# Patient Record
Sex: Male | Born: 1970 | Race: White | Hispanic: No | Marital: Married
Health system: Midwestern US, Community
[De-identification: ages and names within clinical notes are randomized; demographics above are authoritative.]

## PROBLEM LIST (undated history)

## (undated) DIAGNOSIS — E782 Mixed hyperlipidemia: Secondary | ICD-10-CM

## (undated) DIAGNOSIS — M5442 Lumbago with sciatica, left side: Secondary | ICD-10-CM

## (undated) DIAGNOSIS — G8929 Other chronic pain: Secondary | ICD-10-CM

---

## 2003-04-02 NOTE — ED Provider Notes (Signed)
[facility]                      EMERGENCY DEPARTMENT TREATMENT REPORT   NAME:  Alejandro Tate                      PT. LOCATION:     ER  QC02   MR #:         BILLING #: [id number]          DOS: 04/02/2003   TIME: 4:02 P   [id number]   cc:   Primary Physician:   CHIEF COMPLAINT:   Pain, right shoulder.   HISTORY OF PRESENT ILLNESS:  This is a 32 year old male who presents with   pain to right shoulder.  States that a 2 x 10 fell onto his right shoulder   from the second story about 7-8 feet high.  It hit his shoulder and hit his   head.  His neck jerked to the side.  Complaining of pain to right shoulder.   No LOC.  Did not fall to the ground.  No numbness of the arm.  No other   complaints, including headache.   REVIEW OF SYSTEMS:   MUSCULOSKELETAL:  Positive pain, right shoulder.   NEUROLOGICAL:  No loss of consciousness.  Positive head trauma.   PAST MEDICAL HISTORY:   Negative.   MEDICATIONS:  None.   ALLERGIES:  None.   SOCIAL HISTORY:  The injury occurred at work, but states this is not   Doctor, hospital.   PHYSICAL EXAMINATION:   VITAL SIGNS:  Blood pressure 149/77, pulse 101, respirations 20,   temperature 98.5.   GENERAL:  The patient is a well-developed, well-nourished, male, alert.   NECK:  No vertebral tenderness.  Slight tenderness noted to right cervical   paraspinal muscles.   BACK:  Nontender.   EXTREMITY:  Right shoulder-Some tenderness noted to right proximal humerus.   Range of motion intact.  No deformity noted.  No edema noted.  No   ecchymosis.  No clavicular tenderness.  2+ radial pulses.  Light touch   sensation intact.   NEUROLOGICAL:  The patient alert, answering questions appropriately.  No   focal deficits.   CONTINUATION BY DIANA HOULE, PA-C:   DIAGNOSTIC STUDIES: An x-ray of the right shoulder was obtained and was   negative per the emergency department attending.   FINAL DIAGNOSIS:  Contusion, right shoulder.    DISPOSITION: The patient was discharged with a sling and a prescription for   Vicodin. Told to take Advil and ice to the shoulder intermittently. A work   note was given for out of work for two days. The patient was evaluated by   myself and Dr. Jama Flavors agrees with the above assessment and plan.   Electronically Signed By:   Imogene Burn, M.D. 04/04/2003 15:12   ____________________________   Imogene Burn, M.D.   jj/dd(pt2)  D:  04/02/2003  T:  04/02/2003  4:15 P   [id number]   DIANA A HOULE, PA-C

## 2015-06-10 ENCOUNTER — Ambulatory Visit
Admit: 2015-06-10 | Discharge: 2015-06-10 | Payer: PRIVATE HEALTH INSURANCE | Attending: Family Medicine | Primary: Family Medicine

## 2015-06-10 DIAGNOSIS — M5442 Lumbago with sciatica, left side: Secondary | ICD-10-CM

## 2015-06-10 MED ORDER — DICLOFENAC 50 MG TAB, DELAYED RELEASE
50 mg | ORAL_TABLET | Freq: Three times a day (TID) | ORAL | 2 refills | Status: DC
Start: 2015-06-10 — End: 2015-09-12

## 2015-06-10 MED ORDER — TRAMADOL 50 MG TAB
50 mg | ORAL_TABLET | Freq: Four times a day (QID) | ORAL | 0 refills | Status: DC | PRN
Start: 2015-06-10 — End: 2015-06-18

## 2015-06-10 NOTE — Progress Notes (Signed)
HISTORY OF PRESENT ILLNESS  Alejandro Tate is a 44 y.o. male.  HPI Comments: Pt comes in today c/o chronic left sided sciatica and acute low back pain. He says that he has been having left upper leg sciatica for about 2 years now. He says that one morning he was iron and he felt sharp pain in his left hip that felt like fire and brought him down to his knees in pain. He says that a few minutes later his whole left thigh went numb and it has been numb ever since. He says that he has seen 2 MDs, one being Dr Gertie Exon at [facility] and they have done MRIs of his hip and legs and has not given him any answers. He says that he has seen Neuro but no Ortho. He says that about a month ago he started having low back pain. He says that it is a dull and achy pain but with movements he will feel sharp pain and feel cracking and crumbling. He says that he has never had xrays of his back. He says that he does sit for long periods at work but tries to get up and walk around several times a day. He says that he has tried Flexeril, back brace, Ibuprofen, and heat with some relief. He denies fever, chills, sweats, N/V, chest pain, SOB, dizziness.    Back Pain    Associated symptoms include leg pain and tingling. Pertinent negatives include no chest pain, no fever, no headaches, no abdominal pain and no dysuria.   Leg Pain    Associated symptoms include tingling and back pain. Pertinent negatives include no neck pain.       Review of Systems   Constitutional: Negative for chills, diaphoresis, fever and malaise/fatigue.   HENT: Negative for congestion, ear pain, hearing loss, nosebleeds and sore throat.    Eyes: Negative for blurred vision, double vision, pain and redness.   Respiratory: Negative for cough, hemoptysis, sputum production, shortness of breath and wheezing.    Cardiovascular: Negative for chest pain, palpitations and leg swelling.   Gastrointestinal: Negative for abdominal pain, blood in stool,  constipation, diarrhea, nausea and vomiting.   Genitourinary: Negative for dysuria and hematuria.   Musculoskeletal: Positive for back pain. Negative for myalgias and neck pain.   Skin: Negative for rash.   Neurological: Positive for tingling and sensory change. Negative for dizziness, seizures, loss of consciousness and headaches.   Endo/Heme/Allergies: Does not bruise/bleed easily.   Psychiatric/Behavioral: Negative for depression, memory loss and substance abuse. The patient is not nervous/anxious.      Past Medical History   Diagnosis Date   ??? Chronic leg pain      left       Family History   Problem Relation Age of Onset   ??? Diabetes Mother    ??? Cancer Father        Past Surgical History   Procedure Laterality Date   ??? Hx wisdom teeth extraction         Social History     Social History   ??? Marital status: UNKNOWN     Spouse name: N/A   ??? Number of children: N/A   ??? Years of education: N/A     Occupational History   ??? Not on file.     Social History Main Topics   ??? Smoking status: Current Every Day Smoker     Packs/day: 0.50     Years: 20.00   ???  Smokeless tobacco: Not on file   ??? Alcohol use Yes      Comment: occassional   ??? Drug use: No   ??? Sexual activity: Not on file     Other Topics Concern   ??? Military Service Yes   ??? Blood Transfusions No   ??? Caffeine Concern No   ??? Occupational Exposure No   ??? Hobby Hazards No   ??? Sleep Concern No   ??? Stress Concern No   ??? Weight Concern No   ??? Special Diet No   ??? Back Care No   ??? Exercise Yes   ??? Bike Helmet No   ??? Seat Belt Yes   ??? Self-Exams No     Social History Narrative   ??? No narrative on file       Current Outpatient Prescriptions   Medication Sig Dispense Refill   ??? gabapentin (NEURONTIN) 600 mg tablet Take 900 mg by mouth three (3) times daily.  1   ??? diclofenac EC (VOLTAREN) 50 mg EC tablet Take 1 Tab by mouth three (3) times daily. 90 Tab 2   ??? traMADol (ULTRAM) 50 mg tablet Take 1 Tab by mouth every six (6) hours  as needed for Pain. Max Daily Amount: 200 mg. 60 Tab 0       No Known Allergies    Visit Vitals   ??? BP 137/74 (BP 1 Location: Right arm, BP Patient Position: Sitting)   ??? Pulse 81   ??? Temp 98.6 ??F (37 ??C) (Oral)   ??? Resp 18   ??? Ht 6' 1.66" (1.871 m)   ??? Wt 215 lb (97.5 kg)   ??? SpO2 98%   ??? BMI 27.86 kg/m2       Physical Exam   Constitutional: He is oriented to person, place, and time. He appears well-developed and well-nourished. No distress.   HENT:   Head: Normocephalic and atraumatic.   Right Ear: External ear normal.   Left Ear: External ear normal.   Nose: Nose normal.   Mouth/Throat: Oropharynx is clear and moist. No oropharyngeal exudate.   Eyes: Conjunctivae are normal. Pupils are equal, round, and reactive to light. Right eye exhibits no discharge. Left eye exhibits no discharge. No scleral icterus.   Neck: Normal range of motion. Neck supple. No tracheal deviation present. No thyromegaly present.   Cardiovascular: Normal rate, regular rhythm and normal heart sounds.  Exam reveals no gallop and no friction rub.    No murmur heard.  Pulmonary/Chest: Effort normal and breath sounds normal. No respiratory distress. He has no wheezes. He has no rales.   Abdominal: Soft. He exhibits no distension. There is no tenderness.   Musculoskeletal: He exhibits tenderness. He exhibits no edema.   Low back: TTP, pain with ROM esp flexion and lateral deviation   Lymphadenopathy:     He has no cervical adenopathy.   Neurological: He is alert and oriented to person, place, and time. Coordination normal.   Skin: Skin is warm and dry. No rash noted. He is not diaphoretic. No erythema.   Psychiatric: He has a normal mood and affect. His behavior is normal. Judgment and thought content normal.       ASSESSMENT and PLAN  1. Acute midline low back pain with left-sided sciatica  - XR SPINE LUMB 2 OR 3 V; Future ( pt will come tomorrow to get done)  - REFERRAL TO ORTHOPEDICS   - diclofenac EC (VOLTAREN) 50 mg EC tablet; Take 1  Tab by mouth three (3) times daily.  Dispense: 90 Tab; Refill: 2  - traMADol (ULTRAM) 50 mg tablet; Take 1 Tab by mouth every six (6) hours as needed for Pain. Max Daily Amount: 200 mg.  Dispense: 60 Tab; Refill: 0  - rest, heat, back brace, massage, follow up in 3 weeks    2. Left sided sciatica  - REFERRAL TO ORTHOPEDICS  - continue Neurontin    - follow up as needed    Plan and reference materials were reviewed with the patient and the patient expressed understanding.  Patient instructed that if symptoms/condition worsens or fails to resolve to come back to the office or go to the emergency room.    Durenda AgeJasmine K Brie Eppard, [place]

## 2015-06-10 NOTE — Progress Notes (Signed)
1. Have you been to the ER, urgent care clinic since your last visit?  Hospitalized since your last visit?no    2. Have you seen or consulted any other health care providers outside of the [facility] since your last visit?  Include any pap smears or colon screening. No    Patient Alejandro KohBrian Chevalier is a 44 y.o. male presents today for back and leg pain.

## 2015-06-10 NOTE — Patient Instructions (Addendum)
Rest back.  Heat/ice 3-4 times a day.  Continue with back brace. Consider massage.  Use 50 mg Voltaren every 4-6 hours with food as needed for pain.  Use Tramadol every 8 hours as needed for severe pain.  I will call with xray results.  Follow up in 3 weeks.  Please call with any questions or concerns.  If symptoms persist or worsen, please come back or go to the Emergency Room.      Back Care and Preventing Injuries: Care Instructions  Your Care Instructions  You can hurt your back doing many everyday activities: lifting a heavy box, bending down to garden, exercising at the gym, and even getting out of bed. But you can keep your back strong and healthy by doing some exercises. You also can follow a few tips for sitting, sleeping, and lifting to avoid hurting your back again.  Talk to your doctor before you start an exercise program. Ask for help if you want to learn more about keeping your back healthy.  Follow-up care is a key part of your treatment and safety. Be sure to make and go to all appointments, and call your doctor if you are having problems. It's also a good idea to know your test results and keep a list of the medicines you take.  How can you care for yourself at home?  ?? Stay at a healthy weight to avoid strain on your lower back.  ?? Do not smoke. Smoking increases the risk of osteoporosis, which weakens the spine. If you need help quitting, talk to your doctor about stop-smoking programs and medicines. These can increase your chances of quitting for good.  ?? Make sure you sleep in a position that maintains your back's normal curves and on a mattress that feels comfortable. Sleep on your side with a pillow between your knees, or sleep on your back with a pillow under your knees. These positions can reduce strain on your back.  ?? When you get out of bed, lie on your side and bend both knees. Drop your feet over the edge of the bed as you push up with both arms. Scoot to the  edge of the bed. Make sure your feet are in line with your rear end (buttocks), and then stand up.  ?? If you must stand for a long time, put one foot on a stool, ledge, or box.  Exercise to strengthen your back and other muscles  ?? Get at least 30 minutes of exercise on most days of the week. Walking is a good choice. You also may want to do other activities, such as running, swimming, cycling, or playing tennis or team sports.  ?? Stretch your back muscles. Here are few exercises to try:  ?? Lie on your back with your knees bent and your feet flat on the floor. Gently pull one bent knee to your chest. Put that foot back on the floor, and then pull the other knee to your chest. Hold for 15 to 30 seconds. Repeat 2 to 4 times.  ?? Do pelvic tilts. Lie on your back with your knees bent. Tighten your stomach muscles. Pull your belly button (navel) in and up toward your ribs. You should feel like your back is pressing to the floor and your hips and pelvis are slightly lifting off the floor. Hold for 6 seconds while breathing smoothly.  ?? Keep your core muscles strong. The muscles of your back, belly (abdomen), and buttocks support your spine.  ??  Pull in your belly, and imagine pulling your navel toward your spine. Hold this for 6 seconds, then relax. Remember to keep breathing normally as you tense your muscles.  ?? Do curl-ups. Always do them with your knees bent. Keep your low back on the floor, and curl your shoulders toward your knees using a smooth, slow motion. Keep your arms folded across your chest. If this bothers your neck, try putting your hands behind your neck (not your head), with your elbows spread apart.  ?? Lie on your back with your knees bent and your feet flat on the floor. Tighten your belly muscles, and then push with your feet and raise your buttocks up a few inches. Hold this position 6 seconds as you continue to breathe normally, then lower yourself slowly to the floor. Repeat 8 to 12 times.   ?? If you like group exercise, try Pilates or yoga. These classes have poses that strengthen the core muscles.  Protect your back when you sit  ?? Place a small pillow, a rolled-up towel, or a lumbar roll in the curve of your back if you need extra support.  ?? Sit in a chair that is low enough to let you place both feet flat on the floor with both knees nearly level with your hips. If your chair or desk is too high, use a foot rest to raise your knees.  ?? When driving, keep your knees nearly level with your hips. Sit straight, and drive with both hands on the steering wheel. Your arms should be in a slightly bent position.  ?? Try a kneeling chair, which helps tilt your hips forward. This takes pressure off your lower back.  ?? Try sitting on an exercise ball. It can rock from side to side, which helps keep your back loose.  Lift properly  ?? Squat down, bending at the hips and knees only. If you need to, put one knee to the floor and extend your other knee in front of you, bent at a right angle (half kneeling).  ?? Press your chest straight forward. This helps keep your upper back straight while keeping a slight arch in your low back.  ?? Hold the load as close to your body as possible, at the level of your navel.  ?? Use your feet to change direction, taking small steps.  ?? Lead with your hips as you change direction. Keep your shoulders in line with your hips as you move. Do not twist your body.  ?? Set down your load carefully, squatting with your knees and hips only.  When should you call for help?  Watch closely for changes in your health, and be sure to contact your doctor if:  ?? You want more exercises to make your back and other core muscles stronger.  Where can you learn more?  Go to [employer]  Enter S810 in the search box to learn more about "Back Care and Preventing Injuries: Care Instructions."  ?? 2006-2016 [employer]. Care instructions adapted under  license by [employer] (which disclaims liability or warranty for this information). This care instruction is for use with your licensed healthcare professional. If you have questions about a medical condition or this instruction, always ask your healthcare professional. [employer], Inc[employer] disclaims any warranty or liability for your use of this information.  Content Version: 11.0.578772; Current as of: Dec 23, 2014        Back Care and Preventing Injuries: Care Instructions  Your Care  Instructions  You can hurt your back doing many everyday activities: lifting a heavy box, bending down to garden, exercising at the gym, and even getting out of bed. But you can keep your back strong and healthy by doing some exercises. You also can follow a few tips for sitting, sleeping, and lifting to avoid hurting your back again.  Talk to your doctor before you start an exercise program. Ask for help if you want to learn more about keeping your back healthy.  Follow-up care is a key part of your treatment and safety. Be sure to make and go to all appointments, and call your doctor if you are having problems. It's also a good idea to know your test results and keep a list of the medicines you take.  How can you care for yourself at home?  ?? Stay at a healthy weight to avoid strain on your lower back.  ?? Do not smoke. Smoking increases the risk of osteoporosis, which weakens the spine. If you need help quitting, talk to your doctor about stop-smoking programs and medicines. These can increase your chances of quitting for good.  ?? Make sure you sleep in a position that maintains your back's normal curves and on a mattress that feels comfortable. Sleep on your side with a pillow between your knees, or sleep on your back with a pillow under your knees. These positions can reduce strain on your back.  ?? When you get out of bed, lie on your side and bend both knees. Drop your  feet over the edge of the bed as you push up with both arms. Scoot to the edge of the bed. Make sure your feet are in line with your rear end (buttocks), and then stand up.  ?? If you must stand for a long time, put one foot on a stool, ledge, or box.  Exercise to strengthen your back and other muscles  ?? Get at least 30 minutes of exercise on most days of the week. Walking is a good choice. You also may want to do other activities, such as running, swimming, cycling, or playing tennis or team sports.  ?? Stretch your back muscles. Here are few exercises to try:  ?? Lie on your back with your knees bent and your feet flat on the floor. Gently pull one bent knee to your chest. Put that foot back on the floor, and then pull the other knee to your chest. Hold for 15 to 30 seconds. Repeat 2 to 4 times.  ?? Do pelvic tilts. Lie on your back with your knees bent. Tighten your stomach muscles. Pull your belly button (navel) in and up toward your ribs. You should feel like your back is pressing to the floor and your hips and pelvis are slightly lifting off the floor. Hold for 6 seconds while breathing smoothly.  ?? Keep your core muscles strong. The muscles of your back, belly (abdomen), and buttocks support your spine.  ?? Pull in your belly, and imagine pulling your navel toward your spine. Hold this for 6 seconds, then relax. Remember to keep breathing normally as you tense your muscles.  ?? Do curl-ups. Always do them with your knees bent. Keep your low back on the floor, and curl your shoulders toward your knees using a smooth, slow motion. Keep your arms folded across your chest. If this bothers your neck, try putting your hands behind your neck (not your head), with your elbows spread apart.  ?? Lorenz Coaster  on your back with your knees bent and your feet flat on the floor. Tighten your belly muscles, and then push with your feet and raise your buttocks up a few inches. Hold this position 6 seconds as you continue to  breathe normally, then lower yourself slowly to the floor. Repeat 8 to 12 times.  ?? If you like group exercise, try Pilates or yoga. These classes have poses that strengthen the core muscles.  Protect your back when you sit  ?? Place a small pillow, a rolled-up towel, or a lumbar roll in the curve of your back if you need extra support.  ?? Sit in a chair that is low enough to let you place both feet flat on the floor with both knees nearly level with your hips. If your chair or desk is too high, use a foot rest to raise your knees.  ?? When driving, keep your knees nearly level with your hips. Sit straight, and drive with both hands on the steering wheel. Your arms should be in a slightly bent position.  ?? Try a kneeling chair, which helps tilt your hips forward. This takes pressure off your lower back.  ?? Try sitting on an exercise ball. It can rock from side to side, which helps keep your back loose.  Lift properly  ?? Squat down, bending at the hips and knees only. If you need to, put one knee to the floor and extend your other knee in front of you, bent at a right angle (half kneeling).  ?? Press your chest straight forward. This helps keep your upper back straight while keeping a slight arch in your low back.  ?? Hold the load as close to your body as possible, at the level of your navel.  ?? Use your feet to change direction, taking small steps.  ?? Lead with your hips as you change direction. Keep your shoulders in line with your hips as you move. Do not twist your body.  ?? Set down your load carefully, squatting with your knees and hips only.  When should you call for help?  Watch closely for changes in your health, and be sure to contact your doctor if:  ?? You want more exercises to make your back and other core muscles stronger.  Where can you learn more?  Go to [employer]  Enter S810 in the search box to learn more about "Back Care and Preventing Injuries: Care Instructions."   ?? 2006-2016 [employer]. Care instructions adapted under license by [employer] (which disclaims liability or warranty for this information). This care instruction is for use with your licensed healthcare professional. If you have questions about a medical condition or this instruction, always ask your healthcare professional. [employer] disclaims any warranty or liability for your use of this information.  Content Version: 11.0.578772; Current as of: Dec 23, 2014

## 2015-06-11 ENCOUNTER — Encounter
Admit: 2015-06-11 | Discharge: 2015-06-11 | Payer: PRIVATE HEALTH INSURANCE | Attending: Family Medicine | Primary: Family Medicine

## 2015-06-11 ENCOUNTER — Encounter: Admit: 2015-06-11 | Primary: Family Medicine

## 2015-06-11 DIAGNOSIS — M5442 Lumbago with sciatica, left side: Secondary | ICD-10-CM

## 2015-06-11 NOTE — Progress Notes (Signed)
XRAY ONLY.

## 2015-06-13 NOTE — Telephone Encounter (Signed)
Called pt to go over xray results. I let him know that there were some abnormalities and arthritic changes and he needs to see Ortho. He says that he has an appt in December. He says that he would like a copy of the results. He says that the pain meds is helping a little and I told him that he can add Tylenol with Tramadol for more pain relief. The patient expressed understanding and had no further questions.

## 2015-06-18 ENCOUNTER — Ambulatory Visit
Admit: 2015-06-18 | Discharge: 2015-06-18 | Payer: PRIVATE HEALTH INSURANCE | Attending: Family Medicine | Primary: Family Medicine

## 2015-06-18 DIAGNOSIS — M5442 Lumbago with sciatica, left side: Secondary | ICD-10-CM

## 2015-06-18 MED ORDER — OXYCODONE-ACETAMINOPHEN 5 MG-325 MG TAB
5-325 mg | ORAL_TABLET | ORAL | 0 refills | Status: DC | PRN
Start: 2015-06-18 — End: 2015-06-23

## 2015-06-18 NOTE — Patient Instructions (Addendum)
Sciatica: Care Instructions  Your Care Instructions     Sciatica (say "sye-AT-ih-kuh") is an irritation of one of the sciatic nerves, which come from the spinal cord in the lower back. The sciatic nerves and their branches extend down through the buttock to the foot. Sciatica can develop when an injured disc in the back presses against a spinal nerve root. Its main symptom is pain, numbness, or weakness that is often worse in the leg or foot than in the back.  Sciatica often will improve and go away with time. Early treatment usually includes medicines and exercises to relieve pain.  Follow-up care is a key part of your treatment and safety. Be sure to make and go to all appointments, and call your doctor if you are having problems. It's also a good idea to know your test results and keep a list of the medicines you take.  How can you care for yourself at home?  ?? Take pain medicines exactly as directed.  ?? If the doctor gave you a prescription medicine for pain, take it as prescribed.  ?? If you are not taking a prescription pain medicine, ask your doctor if you can take an over-the-counter medicine.  ?? Use heat or ice to relieve pain.  ?? To apply heat, put a warm water bottle, heating pad set on low, or warm cloth on your back. Do not go to sleep with a heating pad on your skin.  ?? To use ice, put ice or a cold pack on the area for 10 to 20 minutes at a time. Put a thin cloth between the ice and your skin.  ?? Avoid sitting if possible, unless it feels better than standing.  ?? Alternate lying down with short walks. Increase your walking distance as you are able to without making your symptoms worse.  ?? Do not do anything that makes your symptoms worse.  When should you call for help?  Call 911 anytime you think you may need emergency care. For example, call if:  ?? You are unable to move a leg at all.  Call your doctor now or seek immediate medical care if:   ?? You have new or worse symptoms in your legs or buttocks. Symptoms may include:  ?? Numbness or tingling.  ?? Weakness.  ?? Pain.  ?? You lose bladder or bowel control.  Watch closely for changes in your health, and be sure to contact your doctor if:  ?? You are not getting better as expected.  Where can you learn more?  Go to http://www.healthwise.net/GoodHelpConnections  Enter Z239 in the search box to learn more about "Sciatica: Care Instructions."  ?? 2006-2016 Healthwise[employer]. Care instructions adapted under license by [employer] (which disclaims liability or warranty for this information). This care instruction is for use with your licensed healthcare professional. If you have questions about a medical condition or this instruction, always ask your healthcare professional. Healthwise, Incorporated disclaims any warranty or liability for your use of this information.  Content Version: 11.0.578772; Current as of: Dec 23, 2014

## 2015-06-18 NOTE — Progress Notes (Signed)
HISTORY OF PRESENT ILLNESS  Alejandro Tate is a 44 y.o. male.  HPI Comments: Pt comes in today for f/u low back pain with left sided sciatica. He says that the pain feels like it is getting worse. He says that when he is not moving he is okay but once he starts moving the pain is worse. He explains that he tried the Tramadol with the Tylenol and it is not working at all to help with the pain. He says that he has an appt with Dr Hinton Dyer (ortho) at 9:30 am today. He denies fever, chills, sweats, N/V, chest pain, SOB, dizziness.    Back Pain    Pertinent negatives include no chest pain, no fever, no headaches, no abdominal pain, no dysuria and no tingling.       Review of Systems   Constitutional: Negative for chills, diaphoresis, fever and malaise/fatigue.   HENT: Negative for congestion, ear pain, hearing loss, nosebleeds and sore throat.    Eyes: Negative for blurred vision, double vision, pain and redness.   Respiratory: Negative for cough, hemoptysis, sputum production, shortness of breath and wheezing.    Cardiovascular: Negative for chest pain, palpitations and leg swelling.   Gastrointestinal: Negative for abdominal pain, blood in stool, constipation, diarrhea, nausea and vomiting.   Genitourinary: Negative for dysuria and hematuria.   Musculoskeletal: Positive for back pain. Negative for myalgias and neck pain.   Skin: Negative for rash.   Neurological: Negative for dizziness, tingling, sensory change, seizures, loss of consciousness and headaches.   Endo/Heme/Allergies: Does not bruise/bleed easily.   Psychiatric/Behavioral: Negative for depression, memory loss and substance abuse. The patient is not nervous/anxious.      Past Medical History   Diagnosis Date   ??? Chronic leg pain      left       Family History   Problem Relation Age of Onset   ??? Diabetes Mother    ??? Cancer Father        Past Surgical History   Procedure Laterality Date   ??? Hx wisdom teeth extraction         Social History      Social History   ??? Marital status: MARRIED     Spouse name: N/A   ??? Number of children: N/A   ??? Years of education: N/A     Occupational History   ??? Not on file.     Social History Main Topics   ??? Smoking status: Current Every Day Smoker     Packs/day: 0.50     Years: 20.00   ??? Smokeless tobacco: Not on file   ??? Alcohol use Yes      Comment: occassional   ??? Drug use: No   ??? Sexual activity: Not on file     Other Topics Concern   ??? Military Service Yes   ??? Blood Transfusions No   ??? Caffeine Concern No   ??? Occupational Exposure No   ??? Hobby Hazards No   ??? Sleep Concern No   ??? Stress Concern No   ??? Weight Concern No   ??? Special Diet No   ??? Back Care No   ??? Exercise Yes   ??? Bike Helmet No   ??? Seat Belt Yes   ??? Self-Exams No     Social History Narrative       Current Outpatient Prescriptions   Medication Sig Dispense Refill   ??? oxyCODONE-acetaminophen (PERCOCET) 5-325 mg per tablet Take 1 Tab by  mouth every four (4) hours as needed for Pain. Max Daily Amount: 6 Tabs. 30 Tab 0   ??? gabapentin (NEURONTIN) 600 mg tablet Take 900 mg by mouth three (3) times daily.  1   ??? diclofenac EC (VOLTAREN) 50 mg EC tablet Take 1 Tab by mouth three (3) times daily. 90 Tab 2       No Known Allergies    Visit Vitals   ??? BP 140/80 (BP 1 Location: Right arm, BP Patient Position: Sitting)   ??? Pulse 76   ??? Temp 99 ??F (37.2 ??C) (Oral)   ??? Resp 16   ??? Ht 6' 1.66" (1.871 m)   ??? Wt 215 lb (97.5 kg)   ??? SpO2 97%   ??? BMI 27.86 kg/m2       Physical Exam   Constitutional: He is oriented to person, place, and time. He appears well-developed and well-nourished. No distress.   HENT:   Head: Normocephalic and atraumatic.   Right Ear: External ear normal.   Left Ear: External ear normal.   Nose: Nose normal.   Mouth/Throat: Oropharynx is clear and moist. No oropharyngeal exudate.   Eyes: Conjunctivae are normal. Pupils are equal, round, and reactive to light. Right eye exhibits no discharge. Left eye exhibits no discharge. No scleral icterus.    Neck: Normal range of motion. Neck supple. No tracheal deviation present. No thyromegaly present.   Cardiovascular: Normal rate, regular rhythm and normal heart sounds.  Exam reveals no gallop and no friction rub.    No murmur heard.  Pulmonary/Chest: Effort normal and breath sounds normal. No respiratory distress. He has no wheezes. He has no rales.   Abdominal: Soft. He exhibits no distension. There is no tenderness.   Musculoskeletal: He exhibits edema and tenderness.   Low back: TTP, pain with ROM   Lymphadenopathy:     He has no cervical adenopathy.   Neurological: He is alert and oriented to person, place, and time. Coordination normal.   Skin: Skin is warm and dry. No rash noted. He is not diaphoretic. No erythema.   Psychiatric: He has a normal mood and affect. His behavior is normal. Judgment and thought content normal.       ASSESSMENT and PLAN  1. Acute bilateral low back pain with left-sided sciatica  - oxyCODONE-acetaminophen (PERCOCET) 5-325 mg per tablet; Take 1 Tab by mouth every four (4) hours as needed for Pain. Max Daily Amount: 6 Tabs.  Dispense: 30 Tab; Refill: 0  - pt will follow up with Ortho  - pt will call if needs more pain meds    2. Left sided sciatica  - oxyCODONE-acetaminophen (PERCOCET) 5-325 mg per tablet; Take 1 Tab by mouth every four (4) hours as needed for Pain. Max Daily Amount: 6 Tabs.  Dispense: 30 Tab; Refill: 0    Plan and reference materials were reviewed with the patient and the patient expressed understanding.  Patient instructed that if symptoms/condition worsens or fails to resolve to come back to the office or go to the emergency room.    Durenda AgeJasmine K Azizi Bally, [place]

## 2015-06-18 NOTE — Progress Notes (Signed)
Alejandro KohBrian Tate is a 44 y.o. male presents to office for follow up on back pain. Patient stated that he feels worse today than he did at the initial visit. Stated that if he gets out of bed that is when the pain starts. Stated that he is okay when he is laying flat. Patient stated that he is scheduled to see ortho today at 9:30     1. Have you been to the ER, urgent care clinic or hospitalized since your last visit? no  2. [facility] since your [facility] visit? no

## 2015-06-23 ENCOUNTER — Encounter

## 2015-06-23 NOTE — Telephone Encounter (Signed)
Pt calling to request refill on pended medication. Pt states Jasmine told him he can just call in and request this medication. Pt is also requesting a work note to put him on light duty. Please advise.

## 2015-06-24 NOTE — Telephone Encounter (Signed)
Mrs. Alejandro Tate,  Pt is requesting a work note restricting him to light duty, no standing for long periods of time, also he is following up on percocet Rx. Pt stated that he still have some but with the holiday and him starting PT will need refill soon. Please advise.

## 2015-06-25 MED ORDER — OXYCODONE-ACETAMINOPHEN 5 MG-325 MG TAB
5-325 mg | ORAL_TABLET | ORAL | 0 refills | Status: DC | PRN
Start: 2015-06-25 — End: 2015-09-12

## 2015-06-25 NOTE — Telephone Encounter (Signed)
Returning pt call. He says that he will start PT. He says that he takes the meds as needed. He says that he needs a work note for light duty. I told him that both would be waiting up front for hi. The patient expressed understanding and had no further questions.

## 2015-07-01 ENCOUNTER — Encounter: Attending: Family Medicine | Primary: Family Medicine

## 2015-07-02 ENCOUNTER — Inpatient Hospital Stay: Admit: 2015-07-02 | Payer: PRIVATE HEALTH INSURANCE | Primary: Family Medicine

## 2015-07-02 DIAGNOSIS — M545 Low back pain: Secondary | ICD-10-CM

## 2015-07-02 NOTE — Progress Notes (Signed)
[facility] ??? [facility] PHYSICAL THERAPY  [address], [place], [place] [address] - Phone: [phone]  Fax: [phone]  PLAN OF CARE / STATEMENT OF MEDICAL NECESSITY FOR PHYSICAL THERAPY SERVICES  Patient Name: Alejandro Tate DOB: 25-Sep-1970   Medical   Diagnosis: LBP Treatment Diagnosis: Low back pain [M54.5]   Onset Date: Chronic, 6-7 years      Referral Source: Delorse Limber, MD Start of Care [facility]): 07/02/2015   Prior Hospitalization: See medical history Provider #: [phone]   Prior Level of Function: Chronic LBP w/ ADLs and L LE numbness x 2 years   Comorbidities: OA, alcohol use, tobacco use, weight change >10# recently   Medications: Verified on Patient Summary List   The Plan of Care and following information is based on the information from the initial evaluation.   ===========================================================================================  Assessment / key information:  44 y/o M presents to PT w/ c/o chronic LBP that began approx 6-7 years ago insidiously. Pt reports symptoms worsened over the last two months but is unable to recall any cause for inc in symptoms. Pain currently ranges 3-10/10 and are located primarily L low back, w/ numbness L upper thigh and intermittent paresthesia into the L calf and foot. Pt c/o weakness L LE, w/ occasional buckling of his L knee. Symptoms are made worse with prolonged sitting (>30 minutes) or standing (>15 minutes) and made better with laying flat supine. 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 Pt also reports he received MRI and NCS several years ago, that he states showed protrusion at L3/4 and dec conduction conduction velocity of the L femoral nerve. Pt denies bowel/bladder dysfunction, but does c/o intermittent pain w/ sneezing.   Objective exam: AROM: lumbar AROM: flex to mid shin (p!), ext dec 50%  (p!), B SB WNL (p!) reported w/ R SB, L rot WNL w/ relief of p!, R rot dec 50% (p!). T/s rot AROM dec 50% to the R, 25% to the L. Dec hip IR PROM B; relief reported w/ hip ER stretch. Strength: Dec strength noted L iliopsoas, L quad and L glute med. (p!) reported on supine brige in the L l/s. Sensation: dec sensation to light touch noted L L3/4 distribution. Special test: (-)SLR B, (-)Slump B, (+)90/90 HS B, (+)Ely L, POE: no change; REIL: peripheralized symptoms to the L foot, RFIL: peripheralized symptoms to the L foot. SKTC: dec symptoms in the L LE. Palpation: Dec tissue extensibility w/ TTP noted L l/s paraspinals, glute med, and QL. Dec t/s and l/s PIVM. FOTO: 54/100.   Pt educated on objective findings, prognosis, POC, body mechanics w/ transfers, and HEP. Pt may benefit from course of PT to address above impairments and functional limitations to enable pt improved activity tolerance.  ===========================================================================================  Problem List: pain affecting function, decrease ROM, decrease strength, impaired gait/ balance, decrease ADL/ functional abilitiies, decrease activity tolerance, decrease flexibility/ joint mobility and decrease transfer abilities   Treatment Plan may include any combination of the following: Therapeutic exercise, Therapeutic activities, Neuromuscular re-education, Physical agent/modality, Gait/balance training, Manual therapy, Patient education, Self Care training, Functional mobility training and Home safety training  Patient / Family readiness to learn indicated by: asking questions, trying to perform skills and interest  Persons(s) to be included in education: patient (P)  Barriers to Learning/Limitations: None  Measures taken:    Patient Goal (s): "pain-free"   Patient self reported health  status: fair  Rehabilitation Potential: good  ? Short Term Goals: To be accomplished in  3  weeks:   1. Pt w// b I and compliant with HEP for self management of symptoms  2. Dec max pain </= 6/10 to improve tolerance to ADLs  ? Long Term Goals: To be accomplished in  6  weeks:  1. Pt will demo l/s AROM WNL all planes to improve ease of transfers and ADLs  2. Improve L quad and psoas MMT to at least 4+/5 to improve stability w/ prolonged ambulation and stair negotiation  3. Improve FOTO score to >/= 64/100 to indicate improved function    Frequency / Duration:   Patient to be seen  2-3  times per week for 4-6  weeks:  Patient / Caregiver education and instruction: exercises  G-Codes (GP): na  Therapist Signature: Garrison ColumbusJenna L Jeswald, PT Date: 07/02/2015   Certification Period: na Time: 5:47 PM   ===========================================================================================  I certify that the above Physical Therapy Services are being furnished while the patient is under my care.  I agree with the treatment plan and certify that this therapy is necessary.    Physician Signature:        Date:       Time:     Please sign and return to [facility] or you may fax the signed copy to [phone].  Thank you.

## 2015-07-02 NOTE — Progress Notes (Signed)
PHYSICAL THERAPY - DAILY TREATMENT NOTE    Patient Name: Alejandro Tate        Date: 07/02/2015  DOB: 07-14-1971   yes Patient DOB Verified  Visit #:   1   of   12  Insurance: Payor: Medical sales representative / Plan: [employer] PPO / Product Type: PPO /      In time: 4:35 Out time: 5:20   Total Treatment Time: 45     Medicare Time Tracking (below)   Total Timed Codes (min):  na 1:1 Treatment Time:  na     TREATMENT AREA =  Low back pain [M54.5]    SUBJECTIVE  Pain Level (on 0 to 10 scale):  6  / 10   Medication Changes/New allergies or changes in medical history, any new surgeries or procedures?    no  If yes, update Summary List   Subjective Functional Status/Changes:    No changes reported     See POC          OBJECTIVE    8 min Therapeutic Exercise:    See flow sheet   Rationale:      increase ROM and increase strength to improve the patient???s ability to tolerate prolonged standing, sitting     10 min Manual Therapy: DTM to L QL, l/s paraspinals, glute min, piriformis str   Rationale:      decrease pain, increase ROM, increase tissue extensibility and decrease trigger points to improve patient's ability to tolerate prolonged standing, sitting     min Patient Education:  yes  Reviewed HEP     Progressed/Changed HEP based on:       Other Objective/Functional Measures:    Pt demo I w/ HEP  Reported dec L LE pain following tx session     Post Treatment Pain Level (on 0 to 10) scale:   2  / 10     ASSESSMENT  Assessment/Changes in Function:     See POC       See Progress Note/Recertification   Patient will continue to benefit from skilled PT services to modify and progress therapeutic interventions, address functional mobility deficits, address ROM deficits, address strength deficits, analyze and address soft tissue restrictions, analyze and cue movement patterns, analyze and modify body mechanics/ergonomics, assess and modify postural abnormalities and instruct in home and community integration to attain remaining goals.    Progress toward goals / Updated goals:    See POC     PLAN    Upgrade activities as tolerated yes Continue plan of care     Discharge due to :      Other:      Therapist: Garrison Lake Lure, PT    Date: 07/02/2015 Time: 5:45 PM     Future Appointments  Date Time Provider Department Center   07/08/2015 4:00 PM Molli Knock, PT [facility]   07/16/2015 4:00 PM Garrison Platinum, PT [facility]   07/21/2015 4:00 PM Garrison [place], PT [facility]   07/23/2015 4:00 PM Garrison Garrett, PT [facility]   07/24/2015 4:30 PM Molli Knock, PT [facility]   07/29/2015 4:00 PM Garrison [place], PT [facility]   07/30/2015 4:00 PM Garrison Humb[place], PT [facility]   07/31/2015 4:30 PM Molli Knock, PT [facility]   08/05/2015 4:00 PM Molli Knock, PT [facility]   08/07/2015 4:00 PM Molli Knock, PT [facility]   08/11/2015 4:00 PM Garrison Naylor, PT  [facility]   08/13/2015 4:00 PM Garrison ColumbusJenna L Jeswald, PT [facility]   08/14/2015 4:00 PM Molli KnockKatie L Hartzog, PT [facility]

## 2015-07-08 ENCOUNTER — Inpatient Hospital Stay
Admit: 2015-07-08 | Payer: PRIVATE HEALTH INSURANCE | Attending: Rehabilitative and Restorative Service Providers" | Primary: Family Medicine

## 2015-07-08 DIAGNOSIS — M545 Low back pain: Secondary | ICD-10-CM

## 2015-07-08 NOTE — Progress Notes (Signed)
Marland Kitchen  PHYSICAL THERAPY - DAILY TREATMENT NOTE    Patient Name: Alejandro Tate        Date: 07/08/2015  DOB: [birth year]   yes Patient DOB Verified  Visit #:   2   of   12  Insurance: Payor: Medical sales representative / Plan: BSHSI CIGNA PPO / Product Type: PPO /      In time: 4:00 Out time: 5:05   Total Treatment Time: 65     Medicare Time Tracking (below)   Total Timed Codes (min):  na 1:1 Treatment Time:  na     TREATMENT AREA =  Low back pain [M54.5]    SUBJECTIVE  Pain Level (on 0 to 10 scale):  3  / 10   Medication Changes/New allergies or changes in medical history, any new surgeries or procedures?    no  If yes, update Summary List   Subjective Functional Status/Changes:    No changes reported     i have been doing my exercises and i am no better no worse.           OBJECTIVE  Modalities Rationale:     decrease inflammation, decrease pain and increase tissue extensibility to improve patient's ability to bend/twist/turn  10 min  Estim, type/location:  prone                                      att       unatt       w/US       w/ice      w/heat    min   Mechanical Traction: type/lbs                     pro     sup     int     cont      before manual      after manual    min   Ultrasound, settings/location:      min   Iontophoresis w/ dexamethasone, location:                                                 take home patch         in clinic    min   Ice       Heat    location/position:     min   Vasopneumatic Device, press/temp:     min   Other:     Skin assessment post-treatment (if applicable):      intact      redness- no adverse reaction     redness ??? adverse reaction:        40 min Therapeutic Exercise:    See flow sheet   Rationale:      increase ROM and increase strength to improve the patient???s ability to bend/stoop     15 min Manual Therapy: Grade i-iii pain mobs l2-5, L Uniglides, stm/dtm ls paraspinals, glutes, QL   Rationale:      decrease pain, increase ROM, increase tissue extensibility  and decrease trigger points to improve patient's ability to sit/bend  Rationale:         min Patient Education:  yes  Reviewed HEP     Progressed/Changed HEP based  on:       Other Objective/Functional Measures:    Pt with trp along glute med at crest and increased restrictions along L lower ls paraspinals  Noted relief with PA mobs - progressed with ppu and will assess response next session to determine directional preference     Post Treatment Pain Level (on 0 to 10) scale:   2  / 10     ASSESSMENT  Assessment/Changes in Function:     Reviewed mri with pt from 2014 - states he has yet to have any additional imaging since that time. Due to inability to determine exacerbating adls/positions - pt was advised to monitor       See Progress Note/Recertification   Patient will continue to benefit from skilled PT services to modify and progress therapeutic interventions, address functional mobility deficits, address ROM deficits, address strength deficits, analyze and address soft tissue restrictions, analyze and cue movement patterns, analyze and modify body mechanics/ergonomics, assess and modify postural abnormalities and instruct in home and community integration to attain remaining goals.   Progress toward goals / Updated goals:    Compliant with hep     PLAN    Upgrade activities as tolerated yes Continue plan of care     Discharge due to :      Other:      Therapist: Molli KnockKatie L Eris Hannan, PT    Date: 07/08/2015 Time: 5:15 PM     Future Appointments  Date Time Provider Department Center   07/16/2015 4:00 PM Garrison ColumbusJenna L Jeswald, [facility]   07/21/2015 4:00 PM Garrison ColumbusJenna L Jeswald, [facility]   07/23/2015 4:00 PM Garrison ColumbusJenna L Jeswald, PT [facility]   07/24/2015 4:30 PM Molli KnockKatie L Mykayla Brinton, PT [facility]   07/29/2015 4:00 PM Garrison ColumbusJenna L Jeswald, PT [facility]   07/30/2015 4:00 PM Garrison ColumbusJenna L Jeswald, PT [facility]   07/31/2015 4:30 PM Molli KnockKatie L Ariaunna Longsworth, PT [facility]   08/05/2015 4:00 PM Molli KnockKatie L Michel Eskelson, PT [facility]    08/07/2015 4:00 PM Molli KnockKatie L Chyane Greer, PT [facility]   08/11/2015 4:00 PM Garrison ColumbusJenna L Jeswald, PT [facility]   08/13/2015 4:00 PM Garrison ColumbusJenna L Jeswald, PT [facility]   08/14/2015 4:00 PM Molli KnockKatie L Necola Bluestein, PT [facility]

## 2015-07-16 ENCOUNTER — Inpatient Hospital Stay: Admit: 2015-07-16 | Payer: PRIVATE HEALTH INSURANCE | Primary: Family Medicine

## 2015-07-16 NOTE — Progress Notes (Signed)
PHYSICAL THERAPY - DAILY TREATMENT NOTE    Patient Name: Alejandro Tate        Date: 07/16/2015  DOB: 1971-07-06   yes Patient DOB Verified  Visit #:   3   of   12  Insurance: Payor: Medical sales representative / Plan: BSHSI CIGNA PPO / Product Type: PPO /      In time: 4:06 Out time: 4:56   Total Treatment Time: 50     Medicare Time Tracking (below)   Total Timed Codes (min):  na 1:1 Treatment Time:  na     TREATMENT AREA =  Low back pain [M54.5]    SUBJECTIVE  Pain Level (on 0 to 10 scale):  2  / 10   Medication Changes/New allergies or changes in medical history, any new surgeries or procedures?    no  If yes, update Summary List   Subjective Functional Status/Changes:    No changes reported     Pt reports he feels slightly better. Continues w/ numbness L thigh and intermittent numbness L lower leg. Reports soreness after last visit but no inc in pain.          OBJECTIVE  Modalities Rationale:     decrease inflammation and decrease pain to improve patient's ability to complete ADLs  10 min  Estim, type/location:  ifc to l/s in prone                                      att       unatt       w/US       w/ice      w/heat    min   Mechanical Traction: type/lbs                     pro     sup     int     cont      before manual      after manual    min   Ultrasound, settings/location:      min   Iontophoresis w/ dexamethasone, location:                                                 take home patch         in clinic    min   Ice       Heat    location/position:     min   Vasopneumatic Device, press/temp:     min   Other:     Skin assessment post-treatment (if applicable):      intact      redness- no adverse reaction     redness ??? adverse reaction:        28 min Therapeutic Exercise:    See flow sheet   Rationale:      increase ROM, increase strength and improve coordination to improve the patient???s ability to  Tolerate prolonged sitting    12 min Manual Therapy: DTM to L>R l/s paraspinals, L glute min, QL; L  multifidi release, L2-5 PA mobs gr II, L3/4 L uniglide   Rationale:      decrease pain, increase ROM, increase tissue extensibility and decrease trigger points to improve patient's ability to bend,  complete ADLs     min Patient Education:  yes  Reviewed HEP     Progressed/Changed HEP based on:       Other Objective/Functional Measures:    Hypertonicity noted L l/s paraspinals and QL  PPU w/ no change to L thigh symptoms  Added core band to deadbugs to assist w/ core activation to deadbugs     Post Treatment Pain Level (on 0 to 10) scale:   1  / 10     ASSESSMENT  Assessment/Changes in Function:     Pt reported dec l/s pain post tx session but no change to L thigh sx. Advised pt to continue PPU as tolerated       See Progress Note/Recertification   Patient will continue to benefit from skilled PT services to modify and progress therapeutic interventions, address functional mobility deficits, address ROM deficits, address strength deficits, analyze and address soft tissue restrictions, analyze and cue movement patterns, analyze and modify body mechanics/ergonomics and instruct in home and community integration to attain remaining goals.   Progress toward goals / Updated goals:    Progressing to STG #2, reports max pain in the last week 6/10     PLAN    Upgrade activities as tolerated yes Continue plan of care     Discharge due to :      Other:      Therapist: Garrison ColumbusJenna L Jeswald, PT    Date: 07/16/2015 Time: 4:10 PM     Future Appointments  Date Time Provider Department Center   07/21/2015 4:00 PM Garrison ColumbusJenna L Jeswald, PT [facility]   07/23/2015 4:00 PM Garrison ColumbusJenna L Jeswald, PT [facility]   07/24/2015 4:30 PM Molli KnockKatie L Hartzog, PT [facility]   07/29/2015 4:00 PM Garrison ColumbusJenna L Jeswald, PT [facility]   07/30/2015 4:00 PM Garrison ColumbusJenna L Jeswald, PT [facility]   07/31/2015 4:30 PM Molli KnockKatie L Hartzog, PT [facility]   08/05/2015 4:00 PM Molli KnockKatie L Hartzog, PT [facility]   08/07/2015 4:00 PM Molli KnockKatie L Hartzog, PT [facility]    08/11/2015 4:00 PM Garrison ColumbusJenna L Jeswald, PT [facility]   08/13/2015 4:00 PM Garrison ColumbusJenna L Jeswald, PT [facility]   08/14/2015 4:00 PM Molli KnockKatie L Hartzog, PT [facility]

## 2015-07-21 ENCOUNTER — Inpatient Hospital Stay: Admit: 2015-07-21 | Payer: PRIVATE HEALTH INSURANCE | Primary: Family Medicine

## 2015-07-21 NOTE — Progress Notes (Signed)
PHYSICAL THERAPY - DAILY TREATMENT NOTE    Patient Name: Alejandro Tate        Date: 07/21/2015  DOB: [birth year]   yes Patient DOB Verified  Visit #:   4   of   12  Insurance: Payor: Medical sales representative / Plan: BSHSI CIGNA PPO / Product Type: PPO /      In time: 3:59 Out time: 4;48   Total Treatment Time: 49     Medicare Time Tracking (below)   Total Timed Codes (min):  na 1:1 Treatment Time:  na     TREATMENT AREA =  Low back pain [M54.5]    SUBJECTIVE  Pain Level (on 0 to 10 scale):  4  / 10   Medication Changes/New allergies or changes in medical history, any new surgeries or procedures?    no  If yes, update Summary List   Subjective Functional Status/Changes:    No changes reported     Pt reports inc back pain this AM due to lifting things around the house this weekend. Reports minimal L LE pain this visit.         OBJECTIVE  Modalities Rationale:     decrease inflammation and decrease pain to improve patient's ability to complete ADLs  10 min  Estim, type/location: IFC to l/s in prone                                      att       unatt       w/US       w/ice      w/heat    min   Mechanical Traction: type/lbs                     pro     sup     int     cont      before manual      after manual    min   Ultrasound, settings/location:      min   Iontophoresis w/ dexamethasone, location:                                                 take home patch         in clinic    min   Ice       Heat    location/position:     min   Vasopneumatic Device, press/temp:     min   Other:     Skin assessment post-treatment (if applicable):      intact      redness- no adverse reaction     redness ??? adverse reaction:        29 min Therapeutic Exercise:    See flow sheet   Rationale:      increase ROM and increase strength to improve the patient???s ability to transfer, stand, ambulate     10 min Manual Therapy: DTM to L>R l/s paraspinals, L glute min, piri, L2-5 PA mobs gr II, L L3/4 multifidi release    Rationale:      decrease pain, increase ROM, increase tissue extensibility and decrease trigger points to improve patient's ability to transfer, tolerate prolonged sitting, standing  min Patient Education:  yes  Reviewed HEP     Progressed/Changed HEP based on:       Other Objective/Functional Measures:    Cont w/ dec PIVM l/s and dec L quad flexibility  Inc discomfort supine bridge     Post Treatment Pain Level (on 0 to 10) scale:   1  / 10     ASSESSMENT  Assessment/Changes in Function:     Pt reported absent LE symptoms and dec l/s pain post tx       See Progress Note/Recertification   Patient will continue to benefit from skilled PT services to modify and progress therapeutic interventions, address functional mobility deficits, address ROM deficits, address strength deficits, analyze and address soft tissue restrictions, analyze and cue movement patterns, analyze and modify body mechanics/ergonomics and instruct in home and community integration to attain remaining goals.   Progress toward goals / Updated goals:    No significant change this visit     PLAN    Upgrade activities as tolerated yes Continue plan of care     Discharge due to :      Other:      Therapist: Garrison ColumbusJenna L Jeswald, PT    Date: 07/21/2015 Time: 4:39 PM     Future Appointments  Date Time Provider Department Center   07/23/2015 4:00 PM Garrison ColumbusJenna L Jeswald, PT [facility]   07/24/2015 4:30 PM Molli KnockKatie L Hartzog, PT [facility]   07/29/2015 4:00 PM Garrison ColumbusJenna L Jeswald, PT [facility]   07/30/2015 4:00 PM Garrison ColumbusJenna L Jeswald, PT [facility]   07/31/2015 4:30 PM Molli KnockKatie L Hartzog, PT [facility]   08/05/2015 4:00 PM Molli KnockKatie L Hartzog, PT [facility]   08/07/2015 4:00 PM Molli KnockKatie L Hartzog, PT [facility]   08/11/2015 4:00 PM Garrison ColumbusJenna L Jeswald, PT [facility]   08/13/2015 4:00 PM Garrison ColumbusJenna L Jeswald, PT [facility]   08/14/2015 4:00 PM Molli KnockKatie L Hartzog, PT [facility]

## 2015-07-23 ENCOUNTER — Inpatient Hospital Stay: Admit: 2015-07-23 | Payer: PRIVATE HEALTH INSURANCE | Primary: Family Medicine

## 2015-07-23 NOTE — Progress Notes (Signed)
PHYSICAL THERAPY - DAILY TREATMENT NOTE    Patient Name: Alejandro Tate        Date: 07/23/2015  DOB: [birth year]   yes Patient DOB Verified  Visit #:   5   of   12  Insurance: Payor: Medical sales representative / Plan: BSHSI CIGNA PPO / Product Type: PPO /      In time: 3:59 Out time: 4:53   Total Treatment Time: 54     Medicare Time Tracking (below)   Total Timed Codes (min):  na 1:1 Treatment Time:  na     TREATMENT AREA =  Low back pain [M54.5]    SUBJECTIVE  Pain Level (on 0 to 10 scale):  3  / 10   Medication Changes/New allergies or changes in medical history, any new surgeries or procedures?    no  If yes, update Summary List   Subjective Functional Status/Changes:    No changes reported     Pt reports slight improvement in symptoms; cont to report constant numbness L thigh and intermittent symptoms below the knee. Pt is unsure if frequency/severity has changed since beginning PT          OBJECTIVE  Modalities Rationale:     decrease inflammation, decrease pain and increase tissue extensibility to improve patient's ability to complete ADLs  10 min  Estim, type/location: hivolt to B l/s paraspinals in prone                                       att       unatt       w/US       w/ice      w/heat    min   Mechanical Traction: type/lbs                     pro     sup     int     cont      before manual      after manual    min   Ultrasound, settings/location:      min   Iontophoresis w/ dexamethasone, location:                                                 take home patch         in clinic    min   Ice       Heat    location/position:     min   Vasopneumatic Device, press/temp:     min   Other:     Skin assessment post-treatment (if applicable):      intact      redness- no adverse reaction     redness ??? adverse reaction:        34 min Therapeutic Exercise:    See flow sheet   Rationale:      increase ROM, increase strength and improve coordination to improve the patient???s ability to complete ADLs, prolonged standing/sitting      10 min Manual Therapy: DTM to L>R l/s paraspinals, L glute min, L piriformis, L prox RF; l/s PA mobs   Rationale:      decrease pain, increase ROM, increase tissue extensibility and decrease trigger points to improve  patient's ability to tolerate prolonged sitting/standing     min Patient Education:  yes  Reviewed HEP     Progressed/Changed HEP based on:       Other Objective/Functional Measures:     Sig TTP noted L RF  Noted improving HS and quad flexibility B     Post Treatment Pain Level (on 0 to 10) scale:   1  / 10     ASSESSMENT  Assessment/Changes in Function:     Pt cont to c/o intermittent L LE symptoms; assess FOTO at NV to assess functional improvement       See Progress Note/Recertification   Patient will continue to benefit from skilled PT services to modify and progress therapeutic interventions, address functional mobility deficits, address ROM deficits, address strength deficits, analyze and address soft tissue restrictions, analyze and cue movement patterns, analyze and modify body mechanics/ergonomics and instruct in home and community integration to attain remaining goals.   Progress toward goals / Updated goals:    Cont be limited l/s flexion ROM     PLAN    Upgrade activities as tolerated yes Continue plan of care     Discharge due to :      Other:      Therapist: Garrison ColumbusJenna L Jeswald, PT    Date: 07/23/2015 Time: 5:31 PM     Future Appointments  Date Time Provider Department Center   07/24/2015 4:30 PM Molli KnockKatie L Hartzog, PT [facility]   07/29/2015 4:00 PM Garrison ColumbusJenna L Jeswald, PT [facility]   07/30/2015 4:00 PM Garrison ColumbusJenna L Jeswald, PT [facility]   07/31/2015 4:30 PM Molli KnockKatie L Hartzog, PT [facility]   08/05/2015 4:00 PM Molli KnockKatie L Hartzog, PT [facility]   08/07/2015 4:00 PM Molli KnockKatie L Hartzog, PT [facility]   08/11/2015 4:00 PM Garrison ColumbusJenna L Jeswald, PT [facility]   08/13/2015 4:00 PM Garrison ColumbusJenna L Jeswald, PT [facility]   08/14/2015 4:00 PM Molli KnockKatie L Hartzog, PT [facility]

## 2015-07-24 ENCOUNTER — Inpatient Hospital Stay
Admit: 2015-07-24 | Payer: PRIVATE HEALTH INSURANCE | Attending: Rehabilitative and Restorative Service Providers" | Primary: Family Medicine

## 2015-07-24 NOTE — Progress Notes (Signed)
Marland Kitchen.  PHYSICAL THERAPY - DAILY TREATMENT NOTE    Patient Name: Alejandro Tate        Date: 07/24/2015  DOB: 10/12/1970   yes Patient DOB Verified  Visit #:   6 of   12  Insurance: Payor: Medical sales representativeCIGNA / Plan: BSHSI CIGNA PPO / Product Type: PPO /      In time: 4:30 Out time: 5:30   Total Treatment Time: 60     Medicare Time Tracking (below)   Total Timed Codes (min):  na 1:1 Treatment Time:  na     TREATMENT AREA =  Low back pain [M54.5]    SUBJECTIVE  Pain Level (on 0 to 10 scale):  4  / 10   Medication Changes/New allergies or changes in medical history, any new surgeries or procedures?    no  If yes, update Summary List   Subjective Functional Status/Changes:    No changes reported     A couple of days ago my low back started to bother me. It came out of nowhere and is not getting better       OBJECTIVE  Modalities Rationale:     decrease inflammation, decrease pain and increase tissue extensibility to improve patient's ability to bend/twist/turn   min  Estim, type/location:                                        att       unatt       w/US       w/ice      w/heat    min   Mechanical Traction: type/lbs                     pro     sup     int     cont      before manual      after manual    min   Ultrasound, settings/location:      min   Iontophoresis w/ dexamethasone, location:                                                 take home patch         in clinic   10 min   Ice       Heat    location/position: Supine with boslter    min   Vasopneumatic Device, press/temp:     min   Other:     Skin assessment post-treatment (if applicable):      intact      redness- no adverse reaction     redness ??? adverse reaction:        35 min Therapeutic Exercise:    See flow sheet   Rationale:      increase ROM and increase strength to improve the patient???s ability to bend/stoop     15 min Manual Therapy: Grade i-iii pain mobs l2-5, L Uniglides, stm/dtm ls paraspinals, glutes, QL    Rationale:      decrease pain, increase ROM, increase tissue extensibility and decrease trigger points to improve patient's ability to sit/bend  Rationale:         min Patient Education:  yes  Reviewed HEP  Progressed/Changed HEP based on:       Other Objective/Functional Measures:    Continues to have pain along lumbosacral junction and c/o of L anterior thigh pain, ttp along ls paraspinals and L sacral sulci  Reported pain along R LS with L prone hip ext even with use of pillows and cues - substantial ls rotation with all core stab te despite cues      Post Treatment Pain Level (on 0 to 10) scale:   2  / 10     ASSESSMENT  Assessment/Changes in Function:     Continues to report constant low back pain and intermittent L thigh numbness that is intermittent and unable to associate any position or adl        See Progress Note/Recertification   Patient will continue to benefit from skilled PT services to modify and progress therapeutic interventions, address functional mobility deficits, address ROM deficits, address strength deficits, analyze and address soft tissue restrictions, analyze and cue movement patterns, analyze and modify body mechanics/ergonomics, assess and modify postural abnormalities and instruct in home and community integration to attain remaining goals.   Progress toward goals / Updated goals:  Reports max 24 hours of pain reduction following PT session but will return to baseline of 4-6/10  FOTO increase 6 points - progress towards ltg     PLAN    Upgrade activities as tolerated yes Continue plan of care     Discharge due to :      Other:      Therapist: Molli Knock, PT    Date: 07/24/2015 Time: 5:15 PM     Future Appointments  Date Time Provider Department Center   07/29/2015 4:00 PM Garrison [place], PT [facility]   07/30/2015 4:00 PM Garrison [place], PT [facility]   07/31/2015 4:30 PM Molli Knock, PT [facility]   08/05/2015 4:00 PM Molli Knock, PT [facility]    08/07/2015 4:00 PM Molli Knock, PT [facility]   08/11/2015 4:00 PM Garrison Benld, PT [facility]   08/13/2015 4:00 PM Garrison [place], PT [facility]   08/14/2015 4:00 PM Molli Knock, PT [facility]

## 2015-07-29 ENCOUNTER — Inpatient Hospital Stay: Admit: 2015-07-29 | Payer: PRIVATE HEALTH INSURANCE | Primary: Family Medicine

## 2015-07-29 NOTE — Progress Notes (Signed)
PHYSICAL THERAPY - DAILY TREATMENT NOTE    Patient Name: Alejandro Tate        Date: 07/29/2015  DOB: 1971-05-10   yes Patient DOB Verified  Visit #:   7   of   12  Insurance: Payor: Garment/textile technologist / Plan: BSHSI CIGNA PPO / Product Type: PPO /      In time: 4:00 Out time: 4:50   Total Treatment Time: 50     Medicare Time Tracking (below)   Total Timed Codes (min):  na 1:1 Treatment Time:  na     TREATMENT AREA =  Low back pain [M54.5]    SUBJECTIVE  Pain Level (on 0 to 10 scale):  3  / 10   Medication Changes/New allergies or changes in medical history, any new surgeries or procedures?    no  If yes, update Summary List   Subjective Functional Status/Changes:    No changes reported     Pt reports having to ascend 3 flights of stairs to his nephews apartment on Christmas day, causing immediate pain L lower back. Denies inc in radicular symptoms. MRI scheduled for Friday.           OBJECTIVE  Modalities Rationale:     decrease pain and increase tissue extensibility to improve patient's ability to bend, tolerate prolonged standing   min  Estim, type/location:                                        att       unatt       w/US       w/ice      w/heat    min   Mechanical Traction: type/lbs                     pro     sup     int     cont      before manual      after manual    min   Ultrasound, settings/location:      min   Iontophoresis w/ dexamethasone, location:                                                 take home patch         in clinic   10 min   Ice       Heat    location/position: To l/s    min   Vasopneumatic Device, press/temp:     min   Other:     Skin assessment post-treatment (if applicable):      intact      redness- no adverse reaction     redness ??? adverse reaction:        25 min Therapeutic Exercise:    See flow sheet   Rationale:      increase ROM and increase strength to improve the patient???s ability to bend, tolerate prolonged postures      15 min Manual Therapy: PA mobs L2-5 gr II, L uniglide, L L3-5 multifidi release, MFR to L sacral border; MET to correct L ant rotated innom and shotgun release   Rationale:      decrease pain, increase ROM, increase tissue extensibility  and decrease trigger points to improve patient's ability to bend, sit     min Patient Education:  yes  Reviewed HEP     Progressed/Changed HEP based on:       Other Objective/Functional Measures:    Noted L ant innom rotation this visit corrected w/ MET  TTP over L PSIS and sacral border  Cont to demo l/s rotation w/ all core ex and prone hip ext     Post Treatment Pain Level (on 0 to 10) scale:   1  / 10     ASSESSMENT  Assessment/Changes in Function:     Advised pt to continue current plan and HEP until receive MRI results       See Progress Note/Recertification   Patient will continue to benefit from skilled PT services to modify and progress therapeutic interventions, address functional mobility deficits, address ROM deficits, address strength deficits, analyze and address soft tissue restrictions, analyze and cue movement patterns, analyze and modify body mechanics/ergonomics, assess and modify postural abnormalities, address imbalance/dizziness and instruct in home and community integration to attain remaining goals.   Progress toward goals / Updated goals:    Minimal progress towards goals thus far     PLAN    Upgrade activities as tolerated yes Continue plan of care     Discharge due to :      Other:      Therapist: Antonietta Jewel, PT    Date: 07/29/2015 Time: 4:32 PM     Future Appointments  Date Time Provider [place]   07/30/2015 4:00 PM Antonietta Jewel, PT [facility]   07/31/2015 4:30 PM Laurette Schimke, PT [facility]   08/05/2015 4:00 PM Laurette Schimke, PT [facility]   08/07/2015 4:00 PM Laurette Schimke, PT [facility]   08/11/2015 4:00 PM Antonietta Jewel, PT [facility]   08/13/2015 4:00 PM Antonietta Jewel, PT [facility]   08/14/2015 4:00 PM Laurette Schimke, PT [facility]

## 2015-07-30 ENCOUNTER — Inpatient Hospital Stay: Admit: 2015-07-30 | Payer: PRIVATE HEALTH INSURANCE | Primary: Family Medicine

## 2015-07-30 NOTE — Progress Notes (Signed)
PHYSICAL THERAPY - DAILY TREATMENT NOTE    Patient Name: Alejandro Tate        Date: 07/30/2015  DOB: 1971-07-21   yes Patient DOB Verified  Visit #:   8   of   12  Insurance: Payor: Medical sales representative / Plan: BSHSI CIGNA PPO / Product Type: PPO /      In time: 4:00 Out time: 4:46   Total Treatment Time: 46     Medicare Time Tracking (below)   Total Timed Codes (min):  na 1:1 Treatment Time:  na     TREATMENT AREA =  Low back pain [M54.5]    SUBJECTIVE  Pain Level (on 0 to 10 scale):  1  / 10   Medication Changes/New allergies or changes in medical history, any new surgeries or procedures?    no  If yes, update Summary List   Subjective Functional Status/Changes:    No changes reported     Pt reports significant improvement in pain after yesterday's session "whatever you did to my hips made me feel a whole ton better"          OBJECTIVE  Modalities Rationale:     decrease pain and increase tissue extensibility to improve patient's ability to tolerate prolonged sitting, standing   min  Estim, type/location:                                        att       unatt       w/US       w/ice      w/heat    min   Mechanical Traction: type/lbs                     pro     sup     int     cont      before manual      after manual    min   Ultrasound, settings/location:      min   Iontophoresis w/ dexamethasone, location:                                                 take home patch         in clinic   10 min   Ice       Heat    location/position: To l/s in supine w/ bolster    min   Vasopneumatic Device, press/temp:     min   Other:     Skin assessment post-treatment (if applicable):      intact      redness- no adverse reaction     redness ??? adverse reaction:        26 min Therapeutic Exercise:    See flow sheet   Rationale:      increase ROM, increase strength and improve coordination to improve the patient???s ability to tolerate prolonged standing/sitting     10 min Manual Therapy: DTM to L>R l/s paraspinals, L glute min, L L3-5  multifidi release; MFR to sacral border, L3-5 uniglides, sacral flex mob   Rationale:      decrease pain, increase ROM, increase tissue extensibility and decrease trigger points to improve patient's ability to tolerate  prolonged postures     min Patient Education:  yes  Reviewed HEP     Progressed/Changed HEP based on:       Other Objective/Functional Measures:    Level innominates  Min TTP L glutes or sacral border     Post Treatment Pain Level (on 0 to 10) scale:   1  / 10     ASSESSMENT  Assessment/Changes in Function:     Pt c/c cont to be numbness L thigh; reports frequency of radiating below knee has lessened       See Progress Note/Recertification   Patient will continue to benefit from skilled PT services to modify and progress therapeutic interventions, address functional mobility deficits, address ROM deficits, address strength deficits, analyze and address soft tissue restrictions, analyze and cue movement patterns, analyze and modify body mechanics/ergonomics and instruct in home and community integration to attain remaining goals.   Progress toward goals / Updated goals:    No change this visit     PLAN    Upgrade activities as tolerated yes Continue plan of care     Discharge due to :      Other:      Therapist: Garrison ColumbusJenna L Jeswald, PT    Date: 07/30/2015 Time: 4:40 PM     Future Appointments  Date Time Provider Department Center   07/31/2015 4:30 PM Molli KnockKatie L Hartzog[facility]   08/05/2015 4:00 PM Molli KnockKatie L Hartzog, PT [facility]   08/07/2015 4:00 PM Molli KnockKatie L Hartzog, PT [facility]   08/11/2015 4:00 PM Garrison ColumbusJenna L Jeswald, PT [facility]   08/13/2015 4:00 PM Garrison ColumbusJenna L Jeswald, PT [facility]   08/14/2015 4:00 PM Molli KnockKatie L Hartzog, PT [facility]

## 2015-07-31 ENCOUNTER — Inpatient Hospital Stay
Admit: 2015-07-31 | Payer: PRIVATE HEALTH INSURANCE | Attending: Rehabilitative and Restorative Service Providers" | Primary: Family Medicine

## 2015-07-31 NOTE — Progress Notes (Signed)
.  PHYSICAL THERAPY - DAILY TREATMENMarland Kitchen NOTE    Patient Name: Alejandro KohBrian Schendel        Date: 07/31/2015  DOB: 1971-01-11   yes Patient DOB Verified  Visit #:   9   of   12  Insurance: Payor: Medical sales representativeCIGNA / Plan: BSHSI CIGNA PPO / Product Type: PPO /      In time: 4:28 Out time: 5:20   Total Treatment Time: 50     Medicare Time Tracking (below)   Total Timed Codes (min):  na 1:1 Treatment Time:  na     TREATMENT AREA =  Low back pain [M54.5]    SUBJECTIVE  Pain Level (on 0 to 10 scale):  1  / 10   Medication Changes/New allergies or changes in medical history, any new surgeries or procedures?    no  If yes, update Summary List   Subjective Functional Status/Changes:    No changes reported     Ever since tuesdays session i have felt amazing. i have not had this much relief in awhile and it has stayed that way. The only thing i really feel is slight muscle tension in my low back         OBJECTIVE  Modalities Rationale:     decrease pain and increase tissue extensibility to improve patient's ability to sit/stand prolonged periods   min  Estim, type/location:                                        att       unatt       w/US       w/ice      w/heat    min   Mechanical Traction: type/lbs                     pro     sup     int     cont      before manual      after manual    min   Ultrasound, settings/location:      min   Iontophoresis w/ dexamethasone, location:                                                 take home patch         in clinic   10 min   Ice       Heat    location/position: Supine with bolster    min   Vasopneumatic Device, press/temp:     min   Other:     Skin assessment post-treatment (if applicable):      intact      redness- no adverse reaction     redness ??? adverse reaction:        25 min Therapeutic Exercise:    See flow sheet   Rationale:      increase ROM and increase strength to improve the patient???s ability to sustained prolonged postions     15 min Manual Therapy: dtm ls paraspinals, glutes, piri, ls lamina  release, sacral flexion mobs, PT op with PPU at l3-sacrum, L Hip anterior mob and hip flexor stretch   Rationale:      decrease pain, increase  ROM, increase tissue extensibility and decrease trigger points to improve patient's ability to stand walk prolonged periods         min Patient Education:  yes  Reviewed HEP     Progressed/Changed HEP based on:       Other Objective/Functional Measures:  Symmetrical pelvis but continues with decreased sacral flexion and PA l3-5 - noted improvement after PT op so progressed to lock and sag in clinic and hep     Post Treatment Pain Level (on 0 to 10) scale:   0  / 10     ASSESSMENT  Assessment/Changes in Function:     Reports increased positional tolerance over the last week       See Progress Note/Recertification   Patient will continue to benefit from skilled PT services to modify and progress therapeutic interventions, address functional mobility deficits, address ROM deficits, address strength deficits, analyze and address soft tissue restrictions, analyze and cue movement patterns, analyze and modify body mechanics/ergonomics, assess and modify postural abnormalities and instruct in home and community integration to attain remaining goals.   Progress toward goals / Updated goals:    In past week pt has reported a significant improvement to overall pain levels with max 3/10 and baseline 1/10.      PLAN    Upgrade activities as tolerated yes Continue plan of care     Discharge due to :      Other:      Therapist: Molli Knock, PT    Date: 07/31/2015 Time: 4:26 PM     Future Appointments  Date Time Provider Department Center   07/31/2015 4:30 PM Molli Knock, PT [facility]   08/05/2015 4:00 PM Molli Knock, PT [facility]   08/07/2015 4:00 PM Molli Knock, PT [facility]   08/11/2015 4:00 PM Garrison [place], PT [facility]   08/13/2015 4:00 PM Garrison [place], PT [facility]   08/14/2015 4:00 PM Molli Knock, PT [facility]

## 2015-08-05 ENCOUNTER — Inpatient Hospital Stay
Admit: 2015-08-05 | Payer: PRIVATE HEALTH INSURANCE | Attending: Rehabilitative and Restorative Service Providers" | Primary: Family Medicine

## 2015-08-05 DIAGNOSIS — M545 Low back pain: Secondary | ICD-10-CM

## 2015-08-05 NOTE — Progress Notes (Signed)
Marland Kitchen  PHYSICAL THERAPY - DAILY TREATMENT NOTE    Patient Name: Alejandro Tate        Date: 08/05/2015  DOB: January 07, 1971   yes Patient DOB Verified  Visit #:   10   of   12  Insurance: Payor: Medical sales representative / Plan: BSHSI CIGNA PPO / Product Type: PPO /      In time: 3:58 Out time: 4:58   Total Treatment Time: 60     Medicare Time Tracking (below)   Total Timed Codes (min):  na 1:1 Treatment Time:  na     TREATMENT AREA =  Low back pain [M54.5]    SUBJECTIVE  Pain Level (on 0 to 10 scale):  1  / 10   Medication Changes/New allergies or changes in medical history, any new surgeries or procedures?    no  If yes, update Summary List   Subjective Functional Status/Changes:    No changes reported     Been feeling great overall with pain levels for over a week now         OBJECTIVE  Modalities Rationale:     decrease pain and increase tissue extensibility to improve patient's ability to sit/stand prolonged periods   min  Estim, type/location:                                        att       unatt       w/US       w/ice      w/heat    min   Mechanical Traction: type/lbs                     pro     sup     int     cont      before manual      after manual    min   Ultrasound, settings/location:      min   Iontophoresis w/ dexamethasone, location:                                                 take home patch         in clinic   10 min   Ice       Heat    location/position: Supine with bolster    min   Vasopneumatic Device, press/temp:     min   Other:     Skin assessment post-treatment (if applicable):      intact      redness- no adverse reaction     redness ??? adverse reaction:        30 min Therapeutic Exercise:    See flow sheet   Rationale:      increase ROM and increase strength to improve the patient???s ability to sustained prolonged postions     20 min Manual Therapy: dtm ls paraspinals, glutes, piri, ls lamina release, sacral flexion mobs, PT op with PPU at l3-sacrum, L Hip anterior mob and hip flexor stretch, L QL and GM release    Rationale:      decrease pain, increase ROM, increase tissue extensibility and decrease trigger points to improve patient's ability to stand walk prolonged periods  min Patient Education:  yes  Reviewed HEP     Progressed/Changed HEP based on:       Other Objective/Functional Measures:  See pn     Post Treatment Pain Level (on 0 to 10) scale:   1  / 10     ASSESSMENT  Assessment/Changes in Function:     See pn       See Progress Note/Recertification   Patient will continue to benefit from skilled PT services to modify and progress therapeutic interventions, address functional mobility deficits, address ROM deficits, address strength deficits, analyze and address soft tissue restrictions, analyze and cue movement patterns, analyze and modify body mechanics/ergonomics, assess and modify postural abnormalities and instruct in home and community integration to attain remaining goals.   Progress toward goals / Updated goals:    See pn.      PLAN    Upgrade activities as tolerated yes Continue plan of care     Discharge due to :      Other:      Therapist: Molli KnockKatie L Jorgia Manthei, PT    Date: 08/05/2015 Time: 4:26 PM     Future Appointments  Date Time Provider Department Center   08/07/2015 4:00 PM Molli KnockKatie L Annaleese Guier, PT [facility]   08/11/2015 4:00 PM Garrison ColumbusJenna L Jeswald, PT [facility]   08/13/2015 4:00 PM Garrison [place]Jenna L Jeswald, PT [facility]   08/14/2015 4:00 PM Molli KnockKatie L Elana Jian, PT [facility]

## 2015-08-05 NOTE — Progress Notes (Signed)
.  [place] [facility]  [address], [place] [address] - Phone: [phone]  Fax: [phone]  PROGRESS NOTE  Patient Name: Alejandro Tate DOB: [birth year]   Treatment/Medical Diagnosis: Low back pain [M54.5]   Referral Source: Lelon Perla, MD     Date of Initial Visit: 07/02/15 Attended Visits: 10 Missed Visits: 0     SUMMARY OF TREATMENT  Pt was seen for IE and treated at this facility for 9 f/u visits with treatment consisting of therapeutic exercises for lumbar/hip rom and core stability, manual therapy (mobs/stm/stretchign) and modalities prn. In addition pt was instructed on hep and activity modification.   CURRENT STATUS  Pt has made good progress thus far with PT. Currently reports 1-3/10 max low back and hip pain depending on length of activity. Continues to have intermittent L anterior thigh numbness that has reduced in frequency but not in duration or intensity. Cont with decreased l1-3 pa and sacral flexion mobility as well as tight psoas, glute med and piri on L.     Goal/Measure of Progress Goal Met?   1.  Pt will demo ls arom wnl all planes to improve ease of transfers and adls   Status at last Eval: Ext 50% P!, rr 50% P! Current Status: Ext 25% progressing   2.  Pt will improve L quad/psoas strength to 4+/5 to improve stability with prolonged ambulation and stair negotiation    Status at last Eval:  Current Status: 4-/5 psoas, 4/5 quad progressing   3.  Pt will increase FOTO >/=64/100 to indicate improved function   Status at last Eval: 54/100 Current Status: 60/100 progressing     New Goals to be achieved in __3-4__  weeks:  1. Cont as above  2.   3.   RECOMMENDATIONS  Cont 2x/3-4 weeks  If you have any questions/comments please contact us directly at [phone].   Thank you for allowing [place] to assist in the care of your patient.    Therapist Signature: Laurette Schimke, PT Date: 08/05/2015     Time: 2:57 PM    NOTE TO PHYSICIAN:  PLEASE COMPLETE THE ORDERS BELOW AND FAX TO   [facility]: [phone]  If you are unable to process this request in 24 hours please contact our office: [phone]    ___ I have read the above report and request that my patient continue as recommended.   ___ I have read the above report and request that my patient continue therapy with the following changes/special instructions:_________________________________________________________   ___ I have read the above report and request that my patient be discharged from therapy.     Physician Signature:        Date:       Time:

## 2015-08-07 ENCOUNTER — Inpatient Hospital Stay
Admit: 2015-08-07 | Payer: PRIVATE HEALTH INSURANCE | Attending: Rehabilitative and Restorative Service Providers" | Primary: Family Medicine

## 2015-08-07 NOTE — Progress Notes (Signed)
Marland Kitchen.  PHYSICAL THERAPY - DAILY TREATMENT NOTE    Patient Name: Alejandro Tate        Date: 08/07/2015  DOB: Dec 17, 1970   yes Patient DOB Verified  Visit #:   11   of   14  Insurance: Payor: Medical sales representativeCIGNA / Plan: BSHSI CIGNA PPO / Product Type: PPO /      In time: 4:05 Out time: 5:05   Total Treatment Time: 60     Medicare Time Tracking (below)   Total Timed Codes (min):  na 1:1 Treatment Time:  na     TREATMENT AREA =  Low back pain [M54.5]    SUBJECTIVE  Pain Level (on 0 to 10 scale):  1  / 10   Medication Changes/New allergies or changes in medical history, any new surgeries or procedures?    no  If yes, update Summary List   Subjective Functional Status/Changes:    No changes reported     i was doing well and had no pain and then just this morning i woke up with a little bit of stiffness and discomfort on my right side.        OBJECTIVE  Modalities Rationale:     decrease pain and increase tissue extensibility to improve patient's ability to sit/stand prolonged periods   min  Estim, type/location:                                        att       unatt       w/US       w/ice      w/heat    min   Mechanical Traction: type/lbs                     pro     sup     int     cont      before manual      after manual    min   Ultrasound, settings/location:      min   Iontophoresis w/ dexamethasone, location:                                                 take home patch         in clinic   10 min   Ice       Heat    location/position: Long sit    min   Vasopneumatic Device, press/temp:     min   Other:     Skin assessment post-treatment (if applicable):      intact      redness- no adverse reaction     redness ??? adverse reaction:        30 min Therapeutic Exercise:    See flow sheet   Rationale:      increase ROM and increase strength to improve the patient???s ability to sustained prolonged postions     20 min Manual Therapy: dtm ls paraspinals, glutes, piri, ls lamina  release, sacral flexion mobs, PT op with PPU at l3-sacrum, L Hip anterior mob and hip flexor stretch, L QL and GM release, R L5/s1 lumbar roll    Rationale:      decrease pain, increase ROM,  increase tissue extensibility and decrease trigger points to improve patient's ability to stand walk prolonged periods         min Patient Education:  yes  Reviewed HEP     Progressed/Changed HEP based on:       Other Objective/Functional Measures:  Arrived to session with limited r rotation and ext with pain at end range ext, performed mt first with noted relief after lumbar roll with improved r rotation rom but no effect on ext  tenderness along lumbosacral junction on R   Post Treatment Pain Level (on 0 to 10) scale:   1  / 10     ASSESSMENT  Assessment/Changes in Function:     Prolonged sitting is chief c/o and limitation per pt for ls pain  Thigh numbness has remained unchanged       See Progress Note/Recertification   Patient will continue to benefit from skilled PT services to modify and progress therapeutic interventions, address functional mobility deficits, address ROM deficits, address strength deficits, analyze and address soft tissue restrictions, analyze and cue movement patterns, analyze and modify body mechanics/ergonomics, assess and modify postural abnormalities and instruct in home and community integration to attain remaining goals.   Progress toward goals / Updated goals:    Pt still continues to have limited LS ext - slow progress to LTG #1     PLAN    Upgrade activities as tolerated yes Continue plan of care     Discharge due to :      Other:      Therapist: Molli Knock, PT    Date: 08/07/2015 Time: 4:26 PM     Future Appointments  Date Time Provider Department Center   08/07/2015 4:00 PM Molli Knock, PT [facility]   08/11/2015 4:00 PM Garrison [place], PT [facility]   08/13/2015 4:00 PM Garrison [place], PT [facility]   08/14/2015 4:00 PM Molli Knock, PT [facility]

## 2015-08-11 ENCOUNTER — Encounter: Payer: PRIVATE HEALTH INSURANCE | Primary: Family Medicine

## 2015-08-13 ENCOUNTER — Inpatient Hospital Stay: Admit: 2015-08-13 | Payer: PRIVATE HEALTH INSURANCE | Primary: Family Medicine

## 2015-08-13 NOTE — Progress Notes (Signed)
PHYSICAL THERAPY - DAILY TREATMENT NOTE    Patient Name: Alejandro Tate        Date: 08/13/2015  DOB: September 11, 1970   yes Patient DOB Verified  Visit #:   12   of   14  Insurance: Payor: Medical sales representativeCIGNA / Plan: BSHSI CIGNA PPO / Product Type: PPO /      In time: 4:05 Out time: 5:00   Total Treatment Time: 55     Medicare Time Tracking (below)   Total Timed Codes (min):  na 1:1 Treatment Time:  na     TREATMENT AREA =  Low back pain [M54.5]    SUBJECTIVE  Pain Level (on 0 to 10 scale):  2  / 10   Medication Changes/New allergies or changes in medical history, any new surgeries or procedures?    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 Reports PM MD advised further ESI however pt elected cont of PT at this time.  Pt reports L LE symptoms extending down the L ant shin when walking into PT this afternoon, subsided once he stopped walking.         OBJECTIVE  Modalities Rationale:     decrease pain and increase tissue extensibility to improve patient's ability to ambulate, stand, transfer   min  Estim, type/location:                                        att       unatt       w/US       w/ice      w/heat    min   Mechanical Traction: type/lbs                     pro     sup     int     cont      before manual      after manual    min   Ultrasound, settings/location:      min   Iontophoresis w/ dexamethasone, location:                                                 take home patch         in clinic   10 min   Ice       Heat    location/position: To l/s in supine    min   Vasopneumatic Device, press/temp:     min   Other:     Skin assessment post-treatment (if applicable):      intact      redness- no adverse reaction     redness ??? adverse reaction:        30 min Therapeutic Exercise:    See flow sheet    Rationale:      increase ROM, increase strength and improve coordination to improve the patient???s ability to tolerate prolonged positioning    15 min Manual Therapy: DTM to L>R l/s paraspinals, glute min,  max, piriformis; L2-4 multifidi release; L hip ant mob, hip flexor str in prone; PPU w/ PT OP 10x5"   Rationale:      decrease pain, increase ROM, increase tissue extensibility and decrease trigger points to improve patient's ability to tolerate prolonged standing/stting     min Patient Education:  yes  Reviewed HEP     Progressed/Changed HEP based on:       Other Objective/Functional Measures:    Pt demo neutral spine w/ min compensations quad LE lift  Dec l/s segmental ext  FOTO: 63  GROC: +4   Post Treatment Pain Level (on 0 to 10) scale:   0  / 10     ASSESSMENT  Assessment/Changes in Function:     Pt reported no pain following tx session w/ continued numbness L lateral thigh. Pt reported moderate improvement in sx via GROC       See Progress Note/Recertification   Patient will continue to benefit from skilled PT services to modify and progress therapeutic interventions, address functional mobility deficits, address ROM deficits, address strength deficits, analyze and address soft tissue restrictions, analyze and cue movement patterns, analyze and modify body mechanics/ergonomics, assess and modify postural abnormalities and instruct in home and community integration to attain remaining goals.   Progress toward goals / Updated goals:    Progressing to LTG #3; FOTO 63/100     PLAN    Upgrade activities as tolerated yes Continue plan of care     Discharge due to :      Other:      Therapist: Garrison Owosso, PT    Date: 08/13/2015 Time: 4:33 PM     Future Appointments  Date Time Provider Department Center   08/14/2015 4:00 PM Molli Knock, PT [facility]

## 2015-08-14 ENCOUNTER — Inpatient Hospital Stay
Admit: 2015-08-14 | Payer: PRIVATE HEALTH INSURANCE | Attending: Rehabilitative and Restorative Service Providers" | Primary: Family Medicine

## 2015-08-14 NOTE — Progress Notes (Signed)
Marland Kitchen  PHYSICAL THERAPY - DAILY TREATMENT NOTE    Patient Name: Alejandro Tate        Date: 08/14/2015  DOB: Sep 07, 1970   yes Patient DOB Verified  Visit #:   13   of   18  Insurance: Payor: Medical sales representative / Plan: BSHSI CIGNA PPO / Product Type: PPO /      In time: 4:08 Out time: 5:05   Total Treatment Time: 55     Medicare Time Tracking (below)   Total Timed Codes (min):  na 1:1 Treatment Time:  na     TREATMENT AREA =  Low back pain [M54.5]    SUBJECTIVE  Pain Level (on 0 to 10 scale):  0  / 10   Medication Changes/New allergies or changes in medical history, any new surgeries or procedures?    no  If yes, update Summary List   Subjective Functional Status/Changes:    No changes reported     i felt great after yesterdays session. Something popped in my hip and have no pain since then.        OBJECTIVE  Modalities Rationale:     decrease pain and increase tissue extensibility to improve patient's ability to sit/stand prolonged periods   min  Estim, type/location:                                        att       unatt       w/US       w/ice      w/heat    min   Mechanical Traction: type/lbs                     pro     sup     int     cont      before manual      after manual    min   Ultrasound, settings/location:      min   Iontophoresis w/ dexamethasone, location:                                                 take home patch         in clinic   10 min   Ice       Heat    location/position: Long sit    min   Vasopneumatic Device, press/temp:     min   Other:     Skin assessment post-treatment (if applicable):      intact      redness- no adverse reaction     redness ??? adverse reaction:        30 min Therapeutic Exercise:    See flow sheet   Rationale:      increase ROM and increase strength to improve the patient???s ability to sustained prolonged postions     15 min Manual Therapy: dtm ls paraspinals, glutes, piri, ls lamina release, sacral flexion mob L Hip ant mob and hip flexor stretch, L QL and GM release,     Rationale:      decrease pain, increase ROM, increase tissue extensibility and decrease trigger points to improve patient's ability to stand walk prolonged periods  min Patient Education:  yes  Reviewed HEP     Progressed/Changed HEP based on:       Other Objective/Functional Measures:  Symmetrical sij, significant reduction in soft tissue restrictions along paraspinals with improved pa mobility throughout l/s  Progressed stability te as per flow sheet with noted "soreness" at end of session   Post Treatment Pain Level (on 0 to 10) scale:   1  / 10     ASSESSMENT  Assessment/Changes in Function:     Thigh numbness has remained unchanged thurs far but pt reports a significant reduction in overall low back pain - reports even able to shovel driveway after snow storm without any increase in pain        See Progress Note/Recertification   Patient will continue to benefit from skilled PT services to modify and progress therapeutic interventions, address functional mobility deficits, address ROM deficits, address strength deficits, analyze and address soft tissue restrictions, analyze and cue movement patterns, analyze and modify body mechanics/ergonomics, assess and modify postural abnormalities and instruct in home and community integration to attain remaining goals.   Progress toward goals / Updated goals:    Continuing to add in te to progress strength goals      PLAN    Upgrade activities as tolerated yes Continue plan of care     Discharge due to :      Other:      Therapist: Molli KnockKatie L Kaeleb Emond, PT    Date: 08/14/2015 Time: 4:26 PM     Future Appointments  Date Time Provider Department Center   08/20/2015 4:00 PM Garrison ColumbusJenna L Jeswald, PT [facility]   08/26/2015 5:00 PM Manuela SchwartzAkihiro Hiyama, PT [facility]   08/27/2015 5:30 PM Garrison ColumbusJenna L Jeswald, PT [facility]   09/02/2015 5:00 PM Manuela SchwartzAkihiro Hiyama, PT [facility]   09/05/2015 3:00 PM Manuela SchwartzAkihiro Hiyama, PT [facility]   09/09/2015 4:30 PM Molli KnockKatie L Amen Dargis, PT [facility]    09/11/2015 5:00 PM Molli KnockKatie L Jorell Agne, PT [facility]   09/16/2015 4:30 PM Molli KnockKatie L Nagi Furio, PT [facility]   09/18/2015 4:30 PM Molli KnockKatie L Tanmay Halteman, PT [facility]   09/23/2015 4:30 PM Molli KnockKatie L Josten [place], PT [facility]   09/25/2015 4:30 PM Molli KnockKatie L Autym Siess, PT [facility]

## 2015-08-20 ENCOUNTER — Inpatient Hospital Stay: Admit: 2015-08-20 | Payer: PRIVATE HEALTH INSURANCE | Primary: Family Medicine

## 2015-08-20 NOTE — Progress Notes (Signed)
PHYSICAL THERAPY - DAILY TREATMENT NOTE    Patient Name: Alejandro Tate        Date: 08/20/2015  DOB: 08/19/1970   yes Patient DOB Verified  Visit #:   69   of   18  Insurance: Payor: Garment/textile technologist / Plan: BSHSI CIGNA PPO / Product Type: PPO /      In time: 4:10 Out time: 5:03   Total Treatment Time: 53     Medicare Time Tracking (below)   Total Timed Codes (min):  na 1:1 Treatment Time:  na     TREATMENT AREA =  Low back pain [M54.5]    SUBJECTIVE  Pain Level (on 0 to 10 scale):  3  / 10   Medication Changes/New allergies or changes in medical history, any new surgeries or procedures?    no  If yes, update Summary List   Subjective Functional Status/Changes:    No changes reported     Pt reports inc pain beginning insidiously this AM and has gradually worsened as the day went on. C/o L lateral and post hip pain and medial thigh pain       OBJECTIVE  Modalities Rationale:     decrease pain and increase tissue extensibility to improve patient's ability to ambulate, stand   min  Estim, type/location:                                        att       unatt       w/US       w/ice      w/heat    min   Mechanical Traction: type/lbs                     pro     sup     int     cont      before manual      after manual    min   Ultrasound, settings/location:      min   Iontophoresis w/ dexamethasone, location:                                                 take home patch         in clinic   10 min   Ice       Heat    location/position: To l/s in supine    min   Vasopneumatic Device, press/temp:     min   Other:     Skin assessment post-treatment (if applicable):      intact      redness- no adverse reaction     redness ??? adverse reaction:         28 min Therapeutic Exercise:    See flow sheet   Rationale:      increase ROM, increase strength and improve coordination to improve the patient???s ability to ambulate, stand     15 min Manual Therapy: MET to correct L post innom, shotgun tech, sacral  flex mobs; DTM to L piriformis, glute min; L l/s multifidi release, L L2/3 UPA   Rationale:      decrease pain, increase ROM, increase tissue extensibility and decrease trigger points to improve patient's ability  to ambulate, stand, transfer     min Patient Education:  yes  Reviewed HEP     Progressed/Changed HEP based on:       Other Objective/Functional Measures:    Pt presenting to PT ambulating w/ antalgic gait and dec step length L  Following manual tx, pt standing w/ improved posture and ambulating w/ normalized gait  C/o pain L distal l/s just superior to iliac crest when rolling supine to prone  Sig soft tissue restrictions noted L l/s multifidi     Post Treatment Pain Level (on 0 to 10) scale:   1  / 10     ASSESSMENT  Assessment/Changes in Function:     Pt reported dec pain post tx session; progress therex as tolerated       See Progress Note/Recertification   Patient will continue to benefit from skilled PT services to modify and progress therapeutic interventions, address functional mobility deficits, address ROM deficits, address strength deficits, analyze and address soft tissue restrictions, analyze and cue movement patterns, analyze and modify body mechanics/ergonomics, assess and modify postural abnormalities and instruct in home and community integration to attain remaining goals.   Progress toward goals / Updated goals:    No significant change towards goals this visit due to inc pain     PLAN    Upgrade activities as tolerated yes Continue plan of care     Discharge due to :      Other:      Therapist: Antonietta Jewel, PT    Date: 08/20/2015 Time: 4:25 PM     Future Appointments  Date Time Provider [place]   08/26/2015 5:00 PM York Grice, PT [facility]   08/27/2015 5:30 PM Antonietta Jewel, PT [facility]   09/02/2015 5:00 PM York Grice, PT [facility]   09/05/2015 3:00 PM York Grice, PT [facility]   09/09/2015 4:30 PM Laurette Schimke, PT [facility]    09/11/2015 5:00 PM Laurette Schimke, PT [facility]   09/16/2015 4:30 PM Laurette Schimke, PT [facility]   09/18/2015 4:30 PM Laurette Schimke, PT [facility]   09/23/2015 4:30 PM Laurette Schimke, PT [facility]   09/25/2015 4:30 PM Laurette Schimke, PT [facility]

## 2015-08-26 ENCOUNTER — Inpatient Hospital Stay: Admit: 2015-08-26 | Payer: PRIVATE HEALTH INSURANCE | Primary: Family Medicine

## 2015-08-26 NOTE — Progress Notes (Signed)
PHYSICAL THERAPY - DAILY TREATMENT NOTE    Patient Name: Alejandro Tate        Date: 08/26/2015  DOB: 1970/10/04   YES Patient DOB Verified  Visit #:   15   of   18  Insurance: Payor: Medical sales representative / Plan: BSHSI CIGNA PPO / Product Type: PPO /      In time: 5:00 Out time: 5:50   Total Treatment Time: 50     Medicare Time Tracking (below)   Total Timed Codes (min):  na 1:1 Treatment Time:  na     TREATMENT AREA =  Low back pain [M54.5]    SUBJECTIVE  Pain Level (on 0 to 10 scale):  1  / 10   Medication Changes/New allergies or changes in medical history, any new surgeries or procedures?    NO    If yes, update Summary List   Subjective Functional Status/Changes:    No changes reported     My pain went down to the side of my low back to the knee yesterday and I had a hard time sleeping          OBJECTIVE  Modalities Rationale:     decrease inflammation and decrease pain to improve patient's ability to perform ADLs   min  Estim, type/location:                                        att       unatt       w/US       w/ice      w/heat    min   Mechanical Traction: type/lbs                     pro     sup     int     cont      before manual      after manual    min   Ultrasound, settings/location:      min   Iontophoresis w/ dexamethasone, location:                                                 take home patch         in clinic   10 min   Ice       Heat    location/position:     min   Vasopneumatic Device, press/temp:     min   Other:     Skin assessment post-treatment (if applicable):      intact      redness- no adverse reaction     redness ??? adverse reaction:        30 min Therapeutic Exercise:    See flow sheet   Rationale:      increase ROM, increase strength and improve coordination to improve the patient???s ability to perform ADLs     10 min Manual Therapy: DTM para, L Gm, piri   Rationale:      decrease pain, increase ROM and increase tissue extensibility to improve patient's ability to perform ADLs        min Patient Education:  YES  Reviewed HEP     Progressed/Changed HEP based on:  Other Objective/Functional Measures:    No lumbar or SI innominate noted today     Post Treatment Pain Level (on 0 to 10) scale:   1  / 10     ASSESSMENT  Assessment/Changes in Function:     Good tol to all Rx without increase in pain        See Progress Note/Recertification   Patient will continue to benefit from skilled PT services to modify and progress therapeutic interventions, address functional mobility deficits, address ROM deficits, address strength deficits, analyze and address soft tissue restrictions, analyze and cue movement patterns, analyze and modify body mechanics/ergonomics and assess and modify postural abnormalities to attain remaining goals.   Progress toward goals / Updated goals:    Slow progress towards LTGs today     PLAN    Upgrade activities as tolerated YES Continue plan of care     Discharge due to :      Other:      Therapist: Viviano Simas, PT, OCS, SCS, CSCS    Date: 08/26/2015 Time: 5:05 PM       Future Appointments  Date Time Provider Department Center   08/27/2015 5:30 PM Garrison Wink, PT [facility]   09/02/2015 5:00 PM Manuela Schwartz, PT [facility]   09/05/2015 3:00 PM Manuela Schwartz, PT [facility]   09/09/2015 4:30 PM Molli Knock, PT [facility]   09/11/2015 5:00 PM Molli Knock, PT [facility]   09/16/2015 4:30 PM Molli Knock, PT [facility]   09/18/2015 4:30 PM Molli Knock, PT [facility]   09/23/2015 4:30 PM Molli Knock, PT [facility]   09/25/2015 4:30 PM Molli Knock, PT [facility]

## 2015-08-27 ENCOUNTER — Inpatient Hospital Stay: Admit: 2015-08-27 | Payer: PRIVATE HEALTH INSURANCE | Primary: Family Medicine

## 2015-08-27 NOTE — Progress Notes (Signed)
PHYSICAL THERAPY - DAILY TREATMENT NOTE    Patient Name: Alejandro Tate        Date: 08/27/2015  DOB: 01/26/1971   yes Patient DOB Verified  Visit #:   82   of   18  Insurance: Payor: Garment/textile technologist / Plan: BSHSI CIGNA PPO / Product Type: PPO /      In time: 5:23 Out time: 6:25   Total Treatment Time: 62     Medicare Time Tracking (below)   Total Timed Codes (min):  na 1:1 Treatment Time:  na     TREATMENT AREA =  Low back pain [M54.5]    SUBJECTIVE  Pain Level (on 0 to 10 scale):  2  / 10   Medication Changes/New allergies or changes in medical history, any new surgeries or procedures?    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 Reports this involved frequent bending and twisting; states he was mindful of body mechanics and tried to sit for most of the task.       OBJECTIVE  Modalities Rationale:     decrease pain and increase tissue extensibility to improve patient's ability to transfer   min  Estim, type/location:                                        att       unatt       w/US       w/ice      w/heat    min   Mechanical Traction: type/lbs                     pro     sup     int     cont      before manual      after manual    min   Ultrasound, settings/location:      min   Iontophoresis w/ dexamethasone, location:                                                 take home patch         in clinic   10 min   Ice       Heat    location/position: To l/s in supine w/ bolster    min   Vasopneumatic Device, press/temp:     min   Other:     Skin assessment post-treatment (if applicable):      intact      redness- no adverse reaction     redness ??? adverse reaction:        37 min Therapeutic Exercise:    See flow sheet   Rationale:      increase ROM, increase strength and improve coordination to improve the patient???s ability to tolerate prolonged postures      15 min Manual Therapy: MET to correct L post innom, shotgun tech, sacral flex mob; DTM/TPR to L GM, piriformis, l/s paraspinals, multifidi release   Rationale:      decrease pain, increase ROM, increase tissue extensibility and decrease  trigger points to improve patient's ability to tolerate prolonged postures     min Patient Education:  yes  Reviewed HEP     Progressed/Changed HEP based on:       Other Objective/Functional Measures:    Reported numbness to pressured from manual rx on L glute med  Pt demo rotation compensation quad multifidi LE lift ex  Added pallof press to improve core stability     Post Treatment Pain Level (on 0 to 10) scale:   1  / 10     ASSESSMENT  Assessment/Changes in Function:     Pt reported dec pain post tx session; f/u w/ MD 09/05/15       See Progress Note/Recertification   Patient will continue to benefit from skilled PT services to modify and progress therapeutic interventions, address functional mobility deficits, address ROM deficits, address strength deficits, analyze and address soft tissue restrictions, analyze and cue movement patterns, analyze and modify body mechanics/ergonomics, assess and modify postural abnormalities and instruct in home and community integration to attain remaining goals.   Progress toward goals / Updated goals:    Cont w/ slow progress LTG #2, quad strength L 4/5     PLAN    Upgrade activities as tolerated yes Continue plan of care     Discharge due to :      Other:      Therapist: Antonietta Jewel, PT    Date: 08/27/2015 Time: 5:26 PM     Future Appointments  Date Time Provider [place]   08/27/2015 5:30 PM Antonietta Jewel, PT [facility]   09/02/2015 5:00 PM York Grice, PT [facility]   09/05/2015 3:00 PM York Grice, PT [facility]   09/09/2015 4:30 PM Laurette Schimke, PT [facility]   09/11/2015 5:00 PM Laurette Schimke, PT [facility]   09/16/2015 4:30 PM Laurette Schimke, PT [facility]   09/18/2015 4:30 PM Laurette Schimke, PT [facility]    09/23/2015 4:30 PM Laurette Schimke, PT [facility]   09/25/2015 4:30 PM Laurette Schimke, PT [facility]

## 2015-09-02 ENCOUNTER — Inpatient Hospital Stay: Admit: 2015-09-02 | Payer: PRIVATE HEALTH INSURANCE | Primary: Family Medicine

## 2015-09-02 NOTE — Progress Notes (Signed)
PHYSICAL THERAPY - DAILY TREATMENT NOTE    Patient Name: Alejandro Tate        Date: 09/02/2015  DOB: 18-Mar-1971   YES Patient DOB Verified  Visit #:   17   of   18  Insurance: Payor: Medical sales representative / Plan: [employer] CIG[employer] PPO / Product Type: PPO /      In time: 5:00 Out time: 5:50   Total Treatment Time: 50     Medicare Time Tracking (below)   Total Timed Codes (min):  na 1:1 Treatment Time:  na     TREATMENT AREA =  Low back pain [M54.5]    SUBJECTIVE  Pain Level (on 0 to 10 scale):  3  / 10   Medication Changes/New allergies or changes in medical history, any new surgeries or procedures?    NO    If yes, update Summary List   Subjective Functional Status/Changes:    No changes reported     I had a little bit of flare ups          OBJECTIVE  Modalities Rationale:     decrease pain to improve patient's ability to perform ADLs   min  Estim, type/location:                                        att       unatt       w/US       w/ice      w/heat    min   Mechanical Traction: type/lbs                     pro     sup     int     cont      before manual      after manual    min   Ultrasound, settings/location:      min   Iontophoresis w/ dexamethasone, location:                                                 take home patch         in clinic   10 min   Ice       Heat    location/position:     min   Vasopneumatic Device, press/temp:     min   Other:     Skin assessment post-treatment (if applicable):      intact      redness- no adverse reaction     redness ??? adverse reaction:        30 min Therapeutic Exercise:    See flow sheet   Rationale:      increase ROM, increase strength and improve coordination to improve the patient???s ability to perform ADLs     10 min Manual Therapy: DTM L Gm, Para, L4 L FRS corr   Rationale:      decrease pain, increase ROM and increase tissue extensibility to improve patient's ability to perform ADLs       min Patient Education:  YES  Reviewed HEP     Progressed/Changed HEP based on:         Other Objective/Functional Measures:    Cont to have difficulty  with maintaining neutral spine with QP ex     Post Treatment Pain Level (on 0 to 10) scale:   1  / 10     ASSESSMENT  Assessment/Changes in Function:     Good tol to all Rx without increase in pain        See Progress Note/Recertification   Patient will continue to benefit from skilled PT services to modify and progress therapeutic interventions, address functional mobility deficits, address ROM deficits, address strength deficits, analyze and address soft tissue restrictions, analyze and cue movement patterns, analyze and modify body mechanics/ergonomics and assess and modify postural abnormalities to attain remaining goals.   Progress toward goals / Updated goals:    Slow progress with pain reduction      PLAN    Upgrade activities as tolerated YES Continue plan of care     Discharge due to :      Other:      Therapist: Viviano Simas, PT, OCS, SCS, CSCS    Date: 09/02/2015 Time: 5:12 PM       Future Appointments  Date Time Provider Department Center   09/05/2015 3:00 PM Manuela Schwartz, PT [facility]   09/09/2015 4:30 PM Molli Knock, PT [facility]   09/11/2015 5:00 PM Molli Knock, PT [facility]   09/12/2015 7:30 AM Durenda Age, [place] [facility]   09/16/2015 4:30 PM Molli Knock, PT [facility]   09/18/2015 4:30 PM Molli Knock, PT [facility]   09/23/2015 4:30 PM Molli Knock, PT [facility]   09/25/2015 4:30 PM Molli Knock, PT [facility]

## 2015-09-05 ENCOUNTER — Inpatient Hospital Stay: Admit: 2015-09-05 | Payer: PRIVATE HEALTH INSURANCE | Primary: Family Medicine

## 2015-09-05 DIAGNOSIS — M545 Low back pain: Secondary | ICD-10-CM

## 2015-09-05 NOTE — Progress Notes (Signed)
PHYSICAL THERAPY - DAILY TREATMENT NOTE    Patient Name: Alejandro Tate        Date: 09/05/2015  DOB: 06-28-1971   YES Patient DOB Verified  Visit #:   89   of   18  Insurance: Payor: Garment/textile technologist / Plan: [employer] CIGNA PPO / Product Type: PPO /      In time: 3:00 Out time: 3:55   Total Treatment Time: 55     Medicare Time Tracking (below)   Total Timed Codes (min):  na 1:1 Treatment Time:  na     TREATMENT AREA =  Low back pain [M54.5]    SUBJECTIVE  Pain Level (on 0 to 10 scale):  1  / 10   Medication Changes/New allergies or changes in medical history, any new surgeries or procedures?    NO    If yes, update Summary List   Subjective Functional Status/Changes:    No changes reported     I met with my MD and getting an injection soon.  My worst pain has been about 3-4/10.            OBJECTIVE  Modalities Rationale:     decrease inflammation and decrease pain to improve patient's ability to perform ADLs   min  Estim, type/location:                                        att       unatt       w/US       w/ice      w/heat    min   Mechanical Traction: type/lbs                     pro     sup     int     cont      before manual      after manual    min   Ultrasound, settings/location:      min   Iontophoresis w/ dexamethasone, location:                                                 take home patch         in clinic   10 min   Ice       Heat    location/position:     min   Vasopneumatic Device, press/temp:     min   Other:     Skin assessment post-treatment (if applicable):      intact      redness- no adverse reaction     redness ??? adverse reaction:        35 min Therapeutic Exercise:    See flow sheet   Rationale:      increase ROM, increase strength and improve coordination to improve the patient???s ability to perform ADLs     10 min Manual Therapy: DTM L Gm, Para   Rationale:      decrease pain, increase ROM and increase tissue extensibility to improve patient's ability to perform ADLs        min Patient Education:  YES  Reviewed HEP     Progressed/Changed HEP based on:  Other Objective/Functional Measures:    FOTO = 63  GROC = +2     Post Treatment Pain Level (on 0 to 10) scale:   1  / 10     ASSESSMENT  Assessment/Changes in Function:     Good tol to all Rx without increase in pain        See Progress Note/Recertification   Patient will continue to benefit from skilled PT services to modify and progress therapeutic interventions, address functional mobility deficits, address ROM deficits, address strength deficits, analyze and address soft tissue restrictions, analyze and cue movement patterns, analyze and modify body mechanics/ergonomics and assess and modify postural abnormalities to attain remaining goals.   Progress toward goals / Updated goals:    Slowly progressing towards all LTGs     PLAN    Upgrade activities as tolerated YES Continue plan of care     Discharge due to :      Other:      Therapist: Fletcher Anon, PT, OCS, SCS, CSCS    Date: 09/05/2015 Time: 3:10 PM       Future Appointments  Date Time Provider [place]   09/09/2015 4:30 PM Laurette Schimke, PT [facility]   09/11/2015 5:00 PM Laurette Schimke, PT [facility]   09/12/2015 7:30 AM Vena Rua, [place]   09/16/2015 4:30 PM Laurette Schimke, PT [facility]   09/18/2015 4:30 PM Laurette Schimke, PT [facility]   09/23/2015 4:30 PM Laurette Schimke, PT [facility]   09/25/2015 4:30 PM Laurette Schimke, PT [facility]

## 2015-09-05 NOTE — Progress Notes (Signed)
[facility] [facility] PHYSICAL THERAPY  [address] [place], [place] [address] - Phone: [phone]  Fax: [phone]  PROGRESS NOTE  Patient Name: Alejandro Tate DOB: [birth year]   Treatment/Medical Diagnosis: Low back pain [M54.5]   Referral Source: Lelon Perla, MD     Date of Initial Visit: 07/02/15 Attended Visits: 18 Missed Visits: 0     SUMMARY OF TREATMENT  Jonmichael Beadnell has been seen at our clinic 2-3x/wk for a total of 18 visits.  Pt treatment has consisted of  therapeutic exercise for lumbar ROM, hip/core strengthening, and manual therapy(jt mobilization and deep tissue mobilization at paraspinus and glutes)  CURRENT STATUS  Pt has had a good tolerance to physical therapy treatment.  He continues to reports tightness and mod pain at his lower lumbar region.  He will be scheduled for an injection soon.  We would like to continue and see how it progress with injection.        Goal/Measure of Progress Goal Met?   1.  Pt will demo ls arom wnl all planes to improve ease of transfers and adls   Status at last Eval: Ext 25% on 08/05/15 Current Status: Full All Planes yes   2.  Increase FOTO score to 64 % show functional improvement   Status at last Eval: 54% Current Status: 63% progressing   3.   Pt will improve L quad/psoas strength to 4+/5 to improve stability with prolonged ambulation and stair negotiation    Status at last Eval: 4-/5 psoas, 4/5 Quad on 08/05/15 Current Status: 4/5 for both progressing     New Goals to be achieved in __3-4__  weeks:  1.  Continue with  goals #2 and #3 aboveabove   2.     3.     RECOMMENDATIONS    Specifics: 2x/wk x 4 more wks  If you have any questions/comments please contact us directly at [phone].   Thank you for allowing [place] to assist in the care of your patient.    Therapist Signature: Fletcher Anon, DPT, OCS, SCS, CSCS Date: 09/05/2015     Time: 4:17 PM   NOTE TO PHYSICIAN:  PLEASE COMPLETE THE ORDERS BELOW AND FAX TO    [facility]: [phone]  If you are unable to process this request in 24 hours please contact our office: [phone]    ___ I have read the above report and request that my patient continue as recommended.   ___ I have read the above report and request that my patient continue therapy with the following changes/special instructions:_________________________________________________________   ___ I have read the above report and request that my patient be discharged from therapy.     Physician Signature:        Date:       Time:

## 2015-09-09 ENCOUNTER — Inpatient Hospital Stay
Admit: 2015-09-09 | Payer: PRIVATE HEALTH INSURANCE | Attending: Rehabilitative and Restorative Service Providers" | Primary: Family Medicine

## 2015-09-09 NOTE — Progress Notes (Signed)
[facility]  PHYSICAL THERAPY - DAILY TREATMENT NOTE    Patient Name: Alejandro Tate        Date: 09/09/2015  DOB: 10/19/1970   yes Patient DOB Verified  Visit #:   19   of   26  Insurance: Payor: Medical sales representative / Plan: [employer]SHSI CIGNA PPO / Product Type: PPO /      In time: 4:30 Out time: 5:35   Total Treatment Time: 60     Medicare Time Tracking (below)   Total Timed Codes (min):  na 1:1 Treatment Time:  na     TREATMENT AREA =  Low back pain [M54.5]    SUBJECTIVE  Pain Level (on 0 to 10 scale):  1  / 10   Medication Changes/New allergies or changes in medical history, any new surgeries or procedures?    no  If yes, update Summary List   Subjective Functional Status/Changes:    No changes reported     My office is moving so i had to help with some furniture moving - i tried not to lift anything heavy but there were some things i just could not avoid. Probably why my hips are bothering me. i have injection this morning.           OBJECTIVE  Modalities Rationale:     decrease pain and increase tissue extensibility to improve patient's ability to perform ADLs   min  Estim, type/location:                                        att       unatt       w/US       w/ice      w/heat    min   Mechanical Traction: type/lbs                     pro     sup     int     cont      before manual      after manual    min   Ultrasound, settings/location:      min   Iontophoresis w/ dexamethasone, location:                                                 take home patch         in clinic   10 min   Ice       Heat    location/position: Supine with bolster    min   Vasopneumatic Device, press/temp:     min   Other:     Skin assessment post-treatment (if applicable):      intact      redness- no adverse reaction     redness ??? adverse reaction:        35 min Therapeutic Exercise:    See flow sheet   Rationale:      increase ROM and increase strength to improve the patient???s ability to perform adls      15 min Manual Therapy: Grade iii pa mobs l2-4, sacral flex mob, dtm ls paraspinals, glutes, piri, B hip ir/er stretch and L RF stretch   Rationale:      decrease  pain, increase ROM, increase tissue extensibility and decrease trigger points to improve patient's ability to participate in adls           min Patient Education:  yes  Reviewed HEP     Progressed/Changed HEP based on:       Other Objective/Functional Measures:    Symmetrical SIJ, decreased l5/s1 pa mobility noted with increased tenderness along L lumbosacral junction  Added in te as per flow sheet to improve hip mobility with left psoas tightness still noted     Post Treatment Pain Level (on 0 to 10) scale:   1  / 10     ASSESSMENT  Assessment/Changes in Function:     Reports lifting/bending still exacerbates hip pain but to less degree of intensity/duration       See Progress Note/Recertification   Patient will continue to benefit from skilled PT services to modify and progress therapeutic interventions, address functional mobility deficits, address ROM deficits, address strength deficits, analyze and address soft tissue restrictions, analyze and cue movement patterns, analyze and modify body mechanics/ergonomics, assess and modify postural abnormalities and instruct in home and community integration to attain remaining goals.   Progress toward goals / Updated goals:    Continues to make slow but steady progress with PT     PLAN    Upgrade activities as tolerated yes Continue plan of care     Discharge due to :      Other:      Therapist: Molli Knock, PT    Date: 09/09/2015 Time: 2:48 PM     Future Appointments  Date Time Provider Department Center   09/09/2015 4:30 PM Molli Knock, PT [facility]   09/11/2015 5:00 PM Molli Knock, PT [facility]   09/12/2015 7:30 AM Durenda Age, [place] [facility]   09/16/2015 4:30 PM Molli Knock, PT [facility]   09/18/2015 4:30 PM Molli Knock, PT [facility]   09/23/2015 4:30 PM Molli Knock, PT [facility]    09/25/2015 4:30 PM Molli Knock, PT [facility]

## 2015-09-11 ENCOUNTER — Inpatient Hospital Stay
Admit: 2015-09-11 | Payer: PRIVATE HEALTH INSURANCE | Attending: Rehabilitative and Restorative Service Providers" | Primary: Family Medicine

## 2015-09-11 NOTE — Progress Notes (Signed)
[facility]  PHYSICAL THERAPY - DAILY TREATMENT NOTE    Patient Name: Alejandro Tate        Date: 09/11/2015  DOB: [birth year]   yes Patient DOB Verified  Visit #:  20   of   26  Insurance: Payor: Medical sales representative / Plan: BSHSI CIGNA PPO / Product Type: PPO /      In time: 5:00 Out time: 5:55   Total Treatment Time: 50     Medicare Time Tracking (below)   Total Timed Codes (min):  na 1:1 Treatment Time:  na     TREATMENT AREA =  Low back pain [M54.5]    SUBJECTIVE  Pain Level (on 0 to 10 scale):  4  / 10   Medication Changes/New allergies or changes in medical history, any new surgeries or procedures?    no  If yes, update Summary List   Subjective Functional Status/Changes:    No changes reported               OBJECTIVE  Modalities Rationale:     decrease pain and increase tissue extensibility to improve patient's ability to perform ADLs   min  Estim, type/location:                                        att       unatt       w/US       w/ice      w/heat    min   Mechanical Traction: type/lbs                     pro     sup     int     cont      before manual      after manual    min   Ultrasound, settings/location:      min   Iontophoresis w/ dexamethasone, location:                                                 take home patch         in clinic   10 min   Ice       Heat    location/position: Supine with bolster    min   Vasopneumatic Device, press/temp:     min   Other:     Skin assessment post-treatment (if applicable):      intact      redness- no adverse reaction     redness ??? adverse reaction:        20 min Therapeutic Exercise:    See flow sheet   Rationale:      increase ROM and increase strength to improve the patient???s ability to perform adls     20 min Manual Therapy: Grade iii pa mobs l2-4, sacral flex mob, dtm ls paraspinals, glutes, piri, B hip ir/er stretch and L RF stretch   Rationale:      decrease pain, increase ROM, increase tissue extensibility and decrease trigger points to improve patient's ability to participate in  adls           min Patient Education:  yes  Reviewed HEP     Progressed/Changed HEP based on:  Other Objective/Functional Measures:    Arrived to session with increased soft tissue restrictions along L ls paraspinals and decreased pa mobility l1-3 and reports of L posterior thigh pain - performed mt first with cavitation noted at l3/4 with reduction of pain  Modified te as per flow sheet to perform only mobility and stretching.      Post Treatment Pain Level (on 0 to 10) scale:   1  / 10     ASSESSMENT  Assessment/Changes in Function:     Highest pain levels have been in over a month.        See Progress Note/Recertification   Patient will continue to benefit from skilled PT services to modify and progress therapeutic interventions, address functional mobility deficits, address ROM deficits, address strength deficits, analyze and address soft tissue restrictions, analyze and cue movement patterns, analyze and modify body mechanics/ergonomics, assess and modify postural abnormalities and instruct in home and community integration to attain remaining goals.   Progress toward goals / Updated goals:    Pt advised to rest and ice over the weekend - continue with only mobility te and also on red flags     PLAN    Upgrade activities as tolerated yes Continue plan of care     Discharge due to :      Other:      Therapist: Molli Knock, PT    Date: 09/11/2015 Time: 2:48 PM     Future Appointments  Date Time Provider Department Center   09/11/2015 5:00 PM Molli Knock, PT [facility]   09/12/2015 7:30 AM Durenda Age, [place] [facility]ED   09/16/2015 4:30 PM Molli Knock, PT [facility]   09/18/2015 4:30 PM Molli Knock, PT [facility]   09/23/2015 4:30 PM Molli Knock, PT [facility]   09/25/2015 4:30 PM Molli Knock, PT [facility]

## 2015-09-12 ENCOUNTER — Encounter: Attending: Family Medicine | Primary: Family Medicine

## 2015-09-12 ENCOUNTER — Inpatient Hospital Stay: Admit: 2015-09-12 | Payer: PRIVATE HEALTH INSURANCE | Primary: Family Medicine

## 2015-09-12 ENCOUNTER — Ambulatory Visit
Admit: 2015-09-12 | Discharge: 2015-09-12 | Payer: PRIVATE HEALTH INSURANCE | Attending: Family Medicine | Primary: Family Medicine

## 2015-09-12 DIAGNOSIS — Z0000 Encounter for general adult medical examination without abnormal findings: Secondary | ICD-10-CM

## 2015-09-12 LAB — METABOLIC PANEL, COMPREHENSIVE
A-G Ratio: 1.4 (ref 0.8–1.7)
ALT (SGPT): 112 U/L — ABNORMAL HIGH (ref 16–61)
AST (SGOT): 32 U/L (ref 15–37)
Albumin: 4.2 g/dL (ref 3.4–5.0)
Alk. phosphatase: 68 U/L (ref 45–117)
Anion gap: 6 mmol/L (ref 3.0–18)
BUN/Creatinine ratio: 12 (ref 12–20)
BUN: 11 MG/DL (ref 7.0–18)
Bilirubin, total: 0.6 MG/DL (ref 0.2–1.0)
CO2: 28 mmol/L (ref 21–32)
Calcium: 9 MG/DL (ref 8.5–10.1)
Chloride: 107 mmol/L (ref 100–108)
Creatinine: 0.95 MG/DL (ref 0.6–1.3)
GFR est AA: >60 mL/min/{1.73_m2} (ref >60)
GFR est non-AA: >60 mL/min/{1.73_m2} (ref >60)
Globulin: 3 g/dL (ref 2.0–4.0)
Glucose: 85 mg/dL (ref 74–99)
Potassium: 4.1 mmol/L (ref 3.5–5.5)
Protein, total: 7.2 g/dL (ref 6.4–8.2)
Sodium: 141 mmol/L (ref 136–145)

## 2015-09-12 LAB — CBC WITH AUTOMATED DIFF
ABS. BASOPHILS: 0 10*3/uL (ref 0.0–0.06)
ABS. EOSINOPHILS: 0.2 10*3/uL (ref 0.0–0.4)
ABS. LYMPHOCYTES: 2.4 10*3/uL (ref 0.9–3.6)
ABS. MONOCYTES: 0.6 10*3/uL (ref 0.05–1.2)
ABS. NEUTROPHILS: 3.2 10*3/uL (ref 1.8–8.0)
BASOPHILS: 1 % (ref 0–2)
EOSINOPHILS: 3 % (ref 0–5)
HCT: 44.9 % (ref 36.0–48.0)
HGB: 14.8 g/dL (ref 13.0–16.0)
LYMPHOCYTES: 38 % (ref 21–52)
MCH: 31.6 PG (ref 24.0–34.0)
MCHC: 33 g/dL (ref 31.0–37.0)
MCV: 95.9 FL (ref 74.0–97.0)
MONOCYTES: 10 % (ref 3–10)
MPV: 12.5 FL — ABNORMAL HIGH (ref 9.2–11.8)
NEUTROPHILS: 48 % (ref 40–73)
PLATELET: 195 10*3/uL (ref 135–420)
RBC: 4.68 M/uL — ABNORMAL LOW (ref 4.70–5.50)
RDW: 13.4 % (ref 11.6–14.5)
WBC: 6.5 10*3/uL (ref 4.6–13.2)

## 2015-09-12 LAB — LIPID PANEL
CHOL/HDL Ratio: 7.4 — ABNORMAL HIGH (ref 0–5.0)
Cholesterol, total: 283 MG/DL — ABNORMAL HIGH (ref <200)
HDL Cholesterol: 38 MG/DL — ABNORMAL LOW (ref 40–60)
LDL, calculated: 189.2 MG/DL — ABNORMAL HIGH (ref 0–100)
Triglyceride: 279 MG/DL — ABNORMAL HIGH (ref <150)
VLDL, calculated: 55.8 MG/DL

## 2015-09-12 LAB — HEMOGLOBIN A1C WITH EAG
Est. average glucose: 108 mg/dL
Hemoglobin A1c: 5.4 % (ref 4.2–5.6)

## 2015-09-12 LAB — TSH 3RD GENERATION: TSH: 1.79 u[IU]/mL (ref 0.36–3.74)

## 2015-09-12 MED ORDER — DICLOFENAC 50 MG TAB, DELAYED RELEASE
50 mg | ORAL_TABLET | Freq: Three times a day (TID) | ORAL | 2 refills | Status: DC
Start: 2015-09-12 — End: 2016-02-09

## 2015-09-12 MED ORDER — GABAPENTIN 600 MG TAB
600 mg | ORAL_TABLET | Freq: Three times a day (TID) | ORAL | 3 refills | Status: DC
Start: 2015-09-12 — End: 2016-01-28

## 2015-09-12 MED ORDER — OXYCODONE-ACETAMINOPHEN 5 MG-325 MG TAB
5-325 mg | ORAL_TABLET | ORAL | 0 refills | Status: DC | PRN
Start: 2015-09-12 — End: 2016-02-15

## 2015-09-12 NOTE — Progress Notes (Signed)
HISTORY OF PRESENT ILLNESS  Alejandro Tate is a 45 y.o. male.  HPI Comments: Pt comes in today for CPE. He says that it has been 2 years since his last CPE. He says that he is doing well. He says that he is doing PT for his lower back pain and it is going okay but only minimal relief. He says that he talked to PM and Ortho and they are suggested shots if PT does not work. He says that he sees [facility] for Ortho and [facility] for PM. He says that Dr Coralie Common does not give out narcotics for PM and was unable to get Percocet from them. He denies fever, chills, sweats, N/V, chest pain, SOB, dizziness.    Complete Physical   Pertinent negatives include no chest pain, no abdominal pain, no headaches and no shortness of breath.       Review of Systems   Constitutional: Negative for chills, diaphoresis, fever and malaise/fatigue.   HENT: Negative for congestion, ear pain, hearing loss, nosebleeds and sore throat.    Eyes: Negative for blurred vision, double vision, pain and redness.   Respiratory: Negative for cough, hemoptysis, sputum production, shortness of breath and wheezing.    Cardiovascular: Negative for chest pain, palpitations and leg swelling.   Gastrointestinal: Negative for abdominal pain, blood in stool, constipation, diarrhea, nausea and vomiting.   Genitourinary: Negative for dysuria and hematuria.   Musculoskeletal: Positive for back pain. Negative for myalgias and neck pain.   Skin: Negative for rash.   Neurological: Negative for dizziness, tingling, sensory change, seizures, loss of consciousness and headaches.   Endo/Heme/Allergies: Does not bruise/bleed easily.   Psychiatric/Behavioral: Negative for depression, memory loss and substance abuse. The patient is not nervous/anxious.      Past Medical History   Diagnosis Date   ??? Chronic leg pain      left       Family History   Problem Relation Age of Onset   ??? Diabetes Mother    ??? Cancer Father        Past Surgical History   Procedure Laterality Date    ??? Hx wisdom teeth extraction         Social History     Social History   ??? Marital status: MARRIED     Spouse name: N/A   ??? Number of children: N/A   ??? Years of education: N/A     Occupational History   ??? Not on file.     Social History Main Topics   ??? Smoking status: Current Every Day Smoker     Packs/day: 0.50     Years: 20.00   ??? Smokeless tobacco: Not on file   ??? Alcohol use Yes      Comment: occassional   ??? Drug use: No   ??? Sexual activity: Not on file     Other Topics Concern   ??? Military Service Yes   ??? Blood Transfusions No   ??? Caffeine Concern No   ??? Occupational Exposure No   ??? Hobby Hazards No   ??? Sleep Concern No   ??? Stress Concern No   ??? Weight Concern No   ??? Special Diet No   ??? Back Care No   ??? Exercise Yes   ??? Bike Helmet No   ??? Seat Belt Yes   ??? Self-Exams No     Social History Narrative       Current Outpatient Prescriptions   Medication Sig Dispense Refill   ???  diclofenac EC (VOLTAREN) 50 mg EC tablet Take 1 Tab by mouth three (3) times daily. 90 Tab 2   ??? oxyCODONE-acetaminophen (PERCOCET) 5-325 mg per tablet Take 1 Tab by mouth every four (4) hours as needed for Pain. Max Daily Amount: 6 Tabs. 90 Tab 0   ??? gabapentin (NEURONTIN) 600 mg tablet Take 1.5 Tabs by mouth three (3) times daily. 135 Tab 3       No Known Allergies    Visit Vitals   ??? BP (!) 134/93 (BP 1 Location: Right arm, BP Patient Position: Sitting)   ??? Pulse 83   ??? Temp 98.2 ??F (36.8 ??C) (Oral)   ??? Resp 16   ??? Ht  (1.854 m)   ??? Wt 223 lb 6.4 oz (101.3 kg)   ??? SpO2 98%   ??? BMI 29.47 kg/m2       Physical Exam   Constitutional: He is oriented to person, place, and time. He appears well-developed and well-nourished. No distress.   HENT:   Head: Normocephalic and atraumatic.   Right Ear: External ear normal.   Left Ear: External ear normal.   Nose: Nose normal.   Mouth/Throat: Oropharynx is clear and moist. No oropharyngeal exudate.   Eyes: Conjunctivae are normal. Pupils are equal, round, and reactive to  light. Right eye exhibits no discharge. Left eye exhibits no discharge. No scleral icterus.   Neck: Normal range of motion. Neck supple. No tracheal deviation present. No thyromegaly present.   Cardiovascular: Normal rate, regular rhythm and normal heart sounds.  Exam reveals no gallop and no friction rub.    No murmur heard.  Pulmonary/Chest: Effort normal and breath sounds normal. No respiratory distress. He has no wheezes. He has no rales.   Abdominal: Soft. He exhibits no distension. There is no tenderness.   Musculoskeletal: Normal range of motion. He exhibits no edema or tenderness.   Lymphadenopathy:     He has no cervical adenopathy.   Neurological: He is alert and oriented to person, place, and time. Coordination normal.   Skin: Skin is warm and dry. No rash noted. He is not diaphoretic. No erythema.   Psychiatric: He has a normal mood and affect. His behavior is normal. Judgment and thought content normal.       ASSESSMENT and PLAN  1. Physical exam, annual  - CBC WITH AUTOMATED DIFF; Future  - METABOLIC PANEL, COMPREHENSIVE; Future  - TSH 3RD GENERATION; Future  - T4, FREE; Future  - VITAMIN D, 25 HYDROXY; Future  - HEMOGLOBIN A1C WITH EAG; Future  - LIPID PANEL; Future  - will call with results    2. Chronic bilateral low back pain with left-sided sciatica  - diclofenac EC (VOLTAREN) 50 mg EC tablet; Take 1 Tab by mouth three (3) times daily.  Dispense: 90 Tab; Refill: 2  - oxyCODONE-acetaminophen (PERCOCET) 5-325 mg per tablet; Take 1 Tab by mouth every four (4) hours as needed for Pain. Max Daily Amount: 6 Tabs.  Dispense: 90 Tab; Refill: 0  - gabapentin (NEURONTIN) 600 mg tablet; Take 1.5 Tabs by mouth three (3) times daily.  Dispense: 135 Tab; Refill: 3  - will follow up with PM and Ortho  - checked pt's PMP    3. Vitamin D deficiency  - VITAMIN D, 25 HYDROXY; Future    - follow up as needed    Plan and reference materials were reviewed with the patient and the patient expressed understanding.   Patient instructed that if  symptoms/condition worsens or fails to resolve to come back to the office or go to the emergency room.    Reviewed previous medical records.    Durenda Age, [place]

## 2015-09-12 NOTE — Patient Instructions (Addendum)
Well Visit, Ages 18 to 50: Care Instructions  Your Care Instructions  Physical exams can help you stay healthy. Your doctor has checked your overall health and may have suggested ways to take good care of yourself. He or she also may have recommended tests. At home, you can help prevent illness with healthy eating, regular exercise, and other steps.  Follow-up care is a key part of your treatment and safety. Be sure to make and go to all appointments, and call your doctor if you are having problems. It's also a good idea to know your test results and keep a list of the medicines you take.  How can you care for yourself at home?  ?? Reach and stay at a healthy weight. This will lower your risk for many problems, such as obesity, diabetes, heart disease, and high blood pressure.  ?? Get at least 30 minutes of physical activity on most days of the week. Walking is a good choice. You also may want to do other activities, such as running, swimming, cycling, or playing tennis or team sports. Discuss any changes in your exercise program with your doctor.  ?? Do not smoke or allow others to smoke around you. If you need help quitting, talk to your doctor about stop-smoking programs and medicines. These can increase your chances of quitting for good.  ?? Talk to your doctor about whether you have any risk factors for sexually transmitted infections (STIs). Having one sex partner (who does not have STIs and does not have sex with anyone else) is a good way to avoid these infections.  ?? Use birth control if you do not want to have children at this time. Talk with your doctor about the choices available and what might be best for you.  ?? Protect your skin from too much sun. When you're outdoors from 10 a.m. to 4 p.m., stay in the shade or cover up with clothing and a hat with a wide brim. Wear sunglasses that block UV rays. Even when it's cloudy, put broad-spectrum sunscreen (SPF 30 or higher) on any exposed skin.   ?? See a dentist one or two times a year for checkups and to have your teeth cleaned.  ?? Wear a seat belt in the car.  ?? Drink alcohol in moderation, if at all. That means no more than 2 drinks a day for men and 1 drink a day for women.  Follow your doctor's advice about when to have certain tests. These tests can spot problems early.  For everyone  ?? Cholesterol. Have the fat (cholesterol) in your blood tested after age 20. Your doctor will tell you how often to have this done based on your age, family history, or other things that can increase your risk for heart disease.  ?? Blood pressure. Have your blood pressure checked during a routine doctor visit. Your doctor will tell you how often to check your blood pressure based on your age, your blood pressure results, and other factors.  ?? Vision. Talk with your doctor about how often to have a glaucoma test.  ?? Diabetes. Ask your doctor whether you should have tests for diabetes.  ?? Colon cancer. Have a test for colon cancer at age 50. You may have one of several tests. If you are younger than 50, you may need a test earlier if you have any risk factors. Risk factors include whether you already had a precancerous polyp removed from your colon or whether your parent, brother,   sister, or child has had colon cancer.  For women  ?? Breast exam and mammogram. Talk to your doctor about when you should have a clinical breast exam and a mammogram. Medical experts differ on whether and how often women under 50 should have these tests. Your doctor can help you decide what is right for you.  ?? Pap test and pelvic exam. Begin Pap tests at age 21. A Pap test is the best way to find cervical cancer. The test often is part of a pelvic exam. Ask how often to have this test.  ?? Tests for sexually transmitted infections (STIs). Ask whether you should have tests for STIs. You may be at risk if you have sex with more than one person, especially if your partners do not wear condoms.   For men  ?? Tests for sexually transmitted infections (STIs). Ask whether you should have tests for STIs. You may be at risk if you have sex with more than one person, especially if you do not wear a condom.  ?? Testicular cancer exam. Ask your doctor whether you should check your testicles regularly.  ?? Prostate exam. Talk to your doctor about whether you should have a blood test (called a PSA test) for prostate cancer. Experts differ on whether and when men should have this test. Some experts suggest it if you are older than 45 and are African-American or have a father or brother who got prostate cancer when he was younger than 65.  When should you call for help?  Watch closely for changes in your health, and be sure to contact your doctor if you have any problems or symptoms that concern you.  Where can you learn more?  Go to http://www.healthwise.net/[employer]Help[employer]ctions.  Enter P072 in the search box to learn more about "Well Visit, Ages 18 to 50: Care Instructions."  Current as of: February 18, 2015  Content Version: 11.1  ?? 2006-2016 [employer]. Care instructions adapted under license by [employer] (which disclaims liability or warranty for this information). If you have questions about a medical condition or this instruction, always ask your healthcare professional. Healthwise, Incorporated disclaims any warranty or liability for your use of this information.

## 2015-09-12 NOTE — Progress Notes (Signed)
Alejandro Tate is a 45 y.o. male presents to office for complete physical.      1. Have you been to the ER, urgent care clinic or hospitalized since your last visit? no  2. Have you seen any other providers outside of [facility] since your last visit? no  3. Have you had a Flu shot this year? no       Health Maintenance items with a due date reviewed with patient:  Health Maintenance Due   Topic Date Due   ??? Pneumococcal 19-64 Medium Risk (1 of 1 - PPSV23) 11/16/1991   ??? DTaP/Tdap/Td series (1 - Tdap) 11/15/1993   ??? INFLUENZA AGE 74 TO ADULT  03/03/2015

## 2015-09-13 LAB — T4, FREE: T4, Free: 1.1 NG/DL (ref 0.7–1.5)

## 2015-09-13 LAB — VITAMIN D, 25 HYDROXY: Vitamin D 25-Hydroxy: 15.8 ng/mL — ABNORMAL LOW (ref 30–100)

## 2015-09-16 ENCOUNTER — Inpatient Hospital Stay
Admit: 2015-09-16 | Payer: PRIVATE HEALTH INSURANCE | Attending: Rehabilitative and Restorative Service Providers" | Primary: Family Medicine

## 2015-09-16 NOTE — Progress Notes (Signed)
[facility]  PHYSICAL THERAPY - DAILY TREATMENT NOTE    Patient Name: Alejandro Tate        Date: 09/16/2015  DOB: [birth year]   yes Patient DOB Verified  Visit #:   21   of   26  Insurance: Payor: Medical sales representative / Plan: [employer] PPO / Product Type: PPO /      In time: 4:30 Out time: 5:32   Total Treatment Time: 60     Medicare Time Tracking (below)   Total Timed Codes (min):  na 1:1 Treatment Time:  na     TREATMENT AREA =  Low back pain [M54.5]    SUBJECTIVE  Pain Level (on 0 to 10 scale):  1  / 10   Medication Changes/New allergies or changes in medical history, any new surgeries or procedures?    no  If yes, update Summary List   Subjective Functional Status/Changes:    No changes reported     i feel much better than i did last time. It just seem to all go away the next day after therapy. i did have to move my desk set up this morning and it did not bother me at all.          OBJECTIVE  Modalities Rationale:     decrease pain and increase tissue extensibility to improve patient's ability to perform ADLs   min  Estim, type/location:                                        att       unatt       w/US       w/ice      w/heat    min   Mechanical Traction: type/lbs                     pro     sup     int     cont      before manual      after manual    min   Ultrasound, settings/location:      min   Iontophoresis w/ dexamethasone, location:                                                 take home patch         in clinic   10 min   Ice       Heat    location/position: Supine with bolster    min   Vasopneumatic Device, press/temp:     min   Other:     Skin assessment post-treatment (if applicable):      intact      redness- no adverse reaction     redness ??? adverse reaction:        35 min Therapeutic Exercise:    See flow sheet   Rationale:      increase ROM and increase strength to improve the patient???s ability to perform adls     15 min Manual Therapy: Grade iii pa mobs l2-4, sacral flex mob, dtm ls  paraspinals, glutes, piri, B hip ir/er stretch and L RF stretch   Rationale:      decrease pain, increase ROM, increase  tissue extensibility and decrease trigger points to improve patient's ability to participate in adls           min Patient Education:  yes  Reviewed HEP     Progressed/Changed HEP based on:       Other Objective/Functional Measures:    Symmetrical SIJ and significant reduction in muscle pain and tightness from previous session   Resumed all te including increased weight with squats and required only one vc to improve hip hinge      Post Treatment Pain Level (on 0 to 10) scale:   1  / 10     ASSESSMENT  Assessment/Changes in Function:            See Progress Note/Recertification   Patient will continue to benefit from skilled PT services to modify and progress therapeutic interventions, address functional mobility deficits, address ROM deficits, address strength deficits, analyze and address soft tissue restrictions, analyze and cue movement patterns, analyze and modify body mechanics/ergonomics, assess and modify postural abnormalities and instruct in home and community integration to attain remaining goals.   Progress toward goals / Updated goals:    Despite recent increase in pain 3/10 that lasted 36 hours pt notes a significant improvement with all s/s by 80%     PLAN    Upgrade activities as tolerated yes Continue plan of care     Discharge due to :      Other:      Therapist: Molli Knock, PT    Date: 09/16/2015 Time: 2:48 PM     Future Appointments  Date Time Provider Department Center   09/18/2015 4:30 PM Molli Knock, PT [facility]   09/23/2015 4:30 PM Molli Knock, PT [facility]   09/25/2015 4:30 PM Molli Knock, PT [facility]

## 2015-09-16 NOTE — Telephone Encounter (Signed)
Called pt to go over lab results. LM.

## 2015-09-18 ENCOUNTER — Telehealth

## 2015-09-18 ENCOUNTER — Inpatient Hospital Stay
Admit: 2015-09-18 | Payer: PRIVATE HEALTH INSURANCE | Attending: Rehabilitative and Restorative Service Providers" | Primary: Family Medicine

## 2015-09-18 NOTE — Progress Notes (Signed)
[facility]  PHYSICAL THERAPY - DAILY TREATMENT NOTE    Patient Name: Alejandro Tate        Date: 09/18/2015  DOB: 18-Apr-1971   yes Patient DOB Verified  Visit #:   22   of   26  Insurance: Payor: Medical sales representative / Plan: [employer] PPO / Product Type: PPO /      In time: 4:30 Out time: 5:32   Total Treatment Time: 60     Medicare Time Tracking (below)   Total Timed Codes (min):  na 1:1 Treatment Time:  na     TREATMENT AREA =  Low back pain [M54.5]    SUBJECTIVE  Pain Level (on 0 to 10 scale):  2  / 10   Medication Changes/New allergies or changes in medical history, any new surgeries or procedures?    no  If yes, update Summary List   Subjective Functional Status/Changes:    No changes reported              OBJECTIVE  Modalities Rationale:     decrease pain and increase tissue extensibility to improve patient's ability to perform ADLs   min  Estim, type/location:                                        att       unatt       w/US       w/ice      w/heat    min   Mechanical Traction: type/lbs                     pro     sup     int     cont      before manual      after manual    min   Ultrasound, settings/location:      min   Iontophoresis w/ dexamethasone, location:                                                 take home patch         in clinic   10 min   Ice       Heat    location/position: Supine with bolster    min   Vasopneumatic Device, press/temp:     min   Other:     Skin assessment post-treatment (if applicable):      intact      redness- no adverse reaction     redness ??? adverse reaction:        35 min Therapeutic Exercise:    See flow sheet   Rationale:      increase ROM and increase strength to improve the patient???s ability to perform adls     15 min Manual Therapy: Grade iii pa mobs l2-4, sacral flex mob, dtm ls paraspinals, glutes, piri, B hip ir/er stretch and L RF stretch   Rationale:      decrease pain, increase ROM, increase tissue extensibility  and decrease trigger points to improve patient's ability to participate in adls           min Patient Education:  yes  Reviewed HEP     Progressed/Changed HEP based on:  Other Objective/Functional Measures:    Good form with all te and min cues required to engage core during qped  Significant reduction in soft tissue restrictions noted     Post Treatment Pain Level (on 0 to 10) scale:   1  / 10     ASSESSMENT  Assessment/Changes in Function:            See Progress Note/Recertification   Patient will continue to benefit from skilled PT services to modify and progress therapeutic interventions, address functional mobility deficits, address ROM deficits, address strength deficits, analyze and address soft tissue restrictions, analyze and cue movement patterns, analyze and modify body mechanics/ergonomics, assess and modify postural abnormalities and instruct in home and community integration to attain remaining goals.   Progress toward goals / Updated goals:    Anticipate discharge next week due to maximum gains with PT achieved. Will perform      PLAN    Upgrade activities as tolerated yes Continue plan of care     Discharge due to :      Other:      Therapist: Molli Knock, PT    Date: 09/18/2015 Time: 2:48 PM     Future Appointments  Date Time Provider Department Center   09/18/2015 4:30 PM Molli Knock, PT [facility]   09/23/2015 4:30 PM Molli Knock, PT [facility]   09/25/2015 4:30 PM Molli Knock, PT [facility]

## 2015-09-18 NOTE — Telephone Encounter (Signed)
Returning call to [facility]

## 2015-09-19 MED ORDER — CHOLECALCIFEROL (VITAMIN D3) 50,000 UNIT TAB
1250 mcg (50,000 unit) | ORAL_TABLET | ORAL | 1 refills | Status: AC
Start: 2015-09-19 — End: 2015-11-08

## 2015-09-19 MED ORDER — ATORVASTATIN 40 MG TAB
40 mg | ORAL_TABLET | Freq: Every day | ORAL | 1 refills | Status: DC
Start: 2015-09-19 — End: 2015-11-10

## 2015-09-19 NOTE — Telephone Encounter (Signed)
I called pt back. I let him know that his Vitamin D was low so I recommended taking 50,000 IU of Vitamin D once weekly for 8 weeks and then recheck blood work.  I also let him know his ALT is elevated and we will recheck in 2 months.  I let him know that his LDL was elevated and he will start Lipitor 40 mg daily and recheck in 2 months.  The patient expressed understanding and had no further questions.

## 2015-09-23 ENCOUNTER — Inpatient Hospital Stay
Admit: 2015-09-23 | Payer: PRIVATE HEALTH INSURANCE | Attending: Rehabilitative and Restorative Service Providers" | Primary: Family Medicine

## 2015-09-23 NOTE — Progress Notes (Signed)
Marland Kitchen  PHYSICAL THERAPY - DAILY TREATMENT NOTE    Patient Name: Alejandro Tate        Date: 09/23/2015  DOB: 1970/09/03   yes Patient DOB Verified  Visit #:   23   of   26  Insurance: Payor: Medical sales representative / Plan: [employer] PPO / Product Type: PPO /      In time: 4:45 Out time: 4:44   Total Treatment Time: 55     Medicare Time Tracking (below)   Total Timed Codes (min):  na 1:1 Treatment Time:  na     TREATMENT AREA =  Low back pain [M54.5]    SUBJECTIVE  Pain Level (on 0 to 10 scale):  1  / 10   Medication Changes/New allergies or changes in medical history, any new surgeries or procedures?    no  If yes, update Summary List   Subjective Functional Status/Changes:    No changes reported     It has actually not been to bad since the last session. i was suppose to get an esi injection but then they rescheduled it until first week in [facility]     OBJECTIVE  Modalities Rationale:     decrease pain and increase tissue extensibility to improve patient's ability to perform ADLs   min  Estim, type/location:                                        att       unatt       w/US       w/ice      w/heat    min   Mechanical Traction: type/lbs                     pro     sup     int     cont      before manual      after manual    min   Ultrasound, settings/location:      min   Iontophoresis w/ dexamethasone, location:                                                 take home patch         in clinic   10 min   Ice       Heat    location/position: Supine with bolster    min   Vasopneumatic Device, press/temp:     min   Other:     Skin assessment post-treatment (if applicable):      intact      redness- no adverse reaction     redness ??? adverse reaction:        35 min Therapeutic Exercise:    See flow sheet   Rationale:      increase ROM and increase strength to improve the patient???s ability to perform adls     10 min Manual Therapy: Grade iii pa mobs l2-4, sacral flex mob, dtm ls paraspinals, glutes, piri, B hip ir/er stretch and L RF stretch    Rationale:      decrease pain, increase ROM, increase tissue extensibility and decrease trigger points to improve patient's ability to participate in adls  min Patient Education:  yes  Reviewed HEP     Progressed/Changed HEP based on:       Other Objective/Functional Measures:    Progressed te as per flow sheet with good form and min pain with all te  Decreased soft tissue restrictions along b paraspinals     Post Treatment Pain Level (on 0 to 10) scale:   1  / 10     ASSESSMENT  Assessment/Changes in Function:     Pt has yet to have sustained pain resolution. It will improve and even up to 72 hours following session but then will return to baseline 1-2/10        See Progress Note/Recertification   Patient will continue to benefit from skilled PT services to modify and progress therapeutic interventions, address functional mobility deficits, address ROM deficits, address strength deficits, analyze and address soft tissue restrictions, analyze and cue movement patterns, analyze and modify body mechanics/ergonomics, assess and modify postural abnormalities and instruct in home and community integration to attain remaining goals.   Progress toward goals / Updated goals:    Pt to have esi on 10/02/15. He agreed to be place don hold following next session and await response to injection. Pt was made aware he has 30 days from last day of therapy otherwise needs a new prescription from MD to continue. Demo verbal understanding     PLAN    Upgrade activities as tolerated yes Continue plan of care     Discharge due to :      Other:      Therapist: Molli Knock, PT    Date: 09/23/2015 Time: 2:48 PM     Future Appointments  Date Time Provider Department Center   09/25/2015 4:30 PM Molli Knock, PT [facility]

## 2015-09-25 ENCOUNTER — Inpatient Hospital Stay
Admit: 2015-09-25 | Payer: PRIVATE HEALTH INSURANCE | Attending: Rehabilitative and Restorative Service Providers" | Primary: Family Medicine

## 2015-09-25 NOTE — Progress Notes (Signed)
Marland Kitchen  PHYSICAL THERAPY - DAILY TREATMENT NOTE    Patient Name: Alejandro Tate        Date: 09/25/2015  DOB: 13-Nov-1970   yes Patient DOB Verified  Visit #:   24   of   26  Insurance: Payor: Medical sales representative / Plan: [employer] PPO / Product Type: PPO /      In time: 4:45 Out time: 5;48   Total Treatment Time: 60     Medicare Time Tracking (below)   Total Timed Codes (min):  na 1:1 Treatment Time:  na     TREATMENT AREA =  Low back pain [M54.5]    SUBJECTIVE  Pain Level (on 0 to 10 scale):  1  / 10   Medication Changes/New allergies or changes in medical history, any new surgeries or procedures?    no  If yes, update Summary List   Subjective Functional Status/Changes:    No changes reported     i did not sleep well last night because going to bed i had some residual numbness on the top part of my foot and then it went up to my hip and thigh     OBJECTIVE  Modalities Rationale:     decrease pain and increase tissue extensibility to improve patient's ability to perform ADLs   min  Estim, type/location:                                        att       unatt       w/US       w/ice      w/heat    min   Mechanical Traction: type/lbs                     pro     sup     int     cont      before manual      after manual    min   Ultrasound, settings/location:      min   Iontophoresis w/ dexamethasone, location:                                                 take home patch         in clinic   10 min   Ice       Heat    location/position: Supine with bolster    min   Vasopneumatic Device, press/temp:     min   Other:     Skin assessment post-treatment (if applicable):      intact      redness- no adverse reaction     redness ??? adverse reaction:        35 min Therapeutic Exercise:    See flow sheet   Rationale:      increase ROM and increase strength to improve the patient???s ability to perform adls     15 min Manual Therapy: Grade iii pa mobs l2-4, sacral flex mob, dtm ls paraspinals, glutes, piri, B hip ir/er stretch and L RF stretch    Rationale:      decrease pain, increase ROM, increase tissue extensibility and decrease trigger points to improve patient's ability to participate  in adls           min Patient Education:  yes  Reviewed HEP     Progressed/Changed HEP based on:       Other Objective/Functional Measures:   no abnormal joint or soft tissue findings with palpation to lumbar region. Denied any le sx throughout session and was bale to complete te as per flow sheet.      Post Treatment Pain Level (on 0 to 10) scale:   1  / 10     ASSESSMENT  Assessment/Changes in Function:   Intermittent L LE numbness that has decreased in intensity, duration and frequency since start of care however is still present.        See Progress Note/Recertification   Patient will continue to benefit from skilled PT services to modify and progress therapeutic interventions, address functional mobility deficits, address ROM deficits, address strength deficits, analyze and address soft tissue restrictions, analyze and cue movement patterns, analyze and modify body mechanics/ergonomics, assess and modify postural abnormalities and instruct in home and community integration to attain remaining goals.   Progress toward goals / Updated goals:    No change in FOTO score - pt was advised to contact our office within 72 hours of esi to determine next step in poc. Was also advised has 30 days from today to be able to be seen in therapy without a new referral. Pt demo verbal understanding     PLAN    Upgrade activities as tolerated yes Continue plan of care     Discharge due to :      Other:      Therapist: Molli Knock, PT    Date: 09/25/2015 Time: 2:48 PM     No future appointments.

## 2015-09-25 NOTE — Progress Notes (Signed)
.  [facility] [place] ??? [address], [place], [place] [address] - Phone: [phone]  Fax: [phone]  DISCHARGE SUMMARY  Patient Name: Alejandro Tate DOB: 06-07-1971   Treatment/Medical Diagnosis: Low back pain [M54.5]   Referral Source: Lelon Perla, MD     Date of Initial Visit: 07/02/15 Attended Visits: 24 Missed Visits: 0     SUMMARY OF TREATMENT  Pt was seen for IE and 23 f/u visits with treatment consisting of therapeutic exercises for lumbar rom/core stability, manual therapy (prom/mobs/stm) and modalities prn. In addition pt was instructed on hep.  CURRENT STATUS  Pt made good progress with PT. He still continued with low level back and L hip pain with high level adls (lifting, etc) at 2/10. At this time he has reached maximum gains with PT and has opted for [facility]     Goal/Measure of Progress Goal Met?   1.  Pt will increase FOTO >/=64/100 in order to show functional improvement    Status at last Eval: 63/100 Current Status: 63/100 no   2.  Pt will improve L quad/psoas strength 4+/5 to improve stability with prolonged ambulation and stair negotitaion    Status at last Eval: 4/5  Current Status:  yes   3.     Status at last Eval:  Current Status:       RECOMMENDATIONS  Discontinue therapy. Progressing towards or have reached established goals.    If you have any questions/comments please contact us directly at [phone].   Thank you for allowing [place] to assist in the care of your patient.    Therapist Signature: Laurette Schimke, PT Date: 12/01/15     Time: 9:40 AM

## 2015-11-10 ENCOUNTER — Encounter

## 2015-11-11 MED ORDER — ATORVASTATIN 40 MG TAB
40 mg | ORAL_TABLET | ORAL | 0 refills | Status: AC
Start: 2015-11-11 — End: ?

## 2016-01-28 ENCOUNTER — Encounter

## 2016-01-30 MED ORDER — GABAPENTIN 600 MG TAB
600 mg | ORAL_TABLET | Freq: Three times a day (TID) | ORAL | 3 refills | Status: DC
Start: 2016-01-30 — End: 2016-06-12

## 2016-02-09 ENCOUNTER — Ambulatory Visit
Admit: 2016-02-09 | Discharge: 2016-02-09 | Payer: PRIVATE HEALTH INSURANCE | Attending: Physician Assistant | Primary: Family Medicine

## 2016-02-09 DIAGNOSIS — G8929 Other chronic pain: Secondary | ICD-10-CM

## 2016-02-09 MED ORDER — DICLOFENAC 50 MG TAB, DELAYED RELEASE
50 mg | ORAL_TABLET | Freq: Three times a day (TID) | ORAL | 2 refills | Status: DC
Start: 2016-02-09 — End: 2016-05-09

## 2016-02-09 NOTE — Patient Instructions (Signed)
Back Pain: Care Instructions  Your Care Instructions    Back pain has many possible causes. It is often related to problems with muscles and ligaments of the back. It may also be related to problems with the nerves, discs, or bones of the back. Moving, lifting, standing, sitting, or sleeping in an awkward way can strain the back. Sometimes you don't notice the injury until later. Arthritis is another common cause of back pain.  Although it may hurt a lot, back pain usually improves on its own within several weeks. Most people recover in 12 weeks or less. Using good home treatment and being careful not to stress your back can help you feel better sooner.  Follow-up care is a key part of your treatment and safety. Be sure to make and go to all appointments, and call your doctor if you are having problems. It???s also a good idea to know your test results and keep a list of the medicines you take.  How can you care for yourself at home?  ?? Sit or lie in positions that are most comfortable and reduce your pain. Try one of these positions when you lie down:  ?? Lie on your back with your knees bent and supported by large pillows.  ?? Lie on the floor with your legs on the seat of a sofa or chair.  ?? Lie on your side with your knees and hips bent and a pillow between your legs.  ?? Lie on your stomach if it does not make pain worse.  ?? Do not sit up in bed, and avoid soft couches and twisted positions. Bed rest can help relieve pain at first, but it delays healing. Avoid bed rest after the first day of back pain.  ?? Change positions every 30 minutes. If you must sit for long periods of time, take breaks from sitting. Get up and walk around, or lie in a comfortable position.  ?? Try using a heating pad on a low or medium setting for 15 to 20 minutes every 2 or 3 hours. Try a warm shower in place of one session with the heating pad.  ?? You can also try an ice pack for 10 to 15 minutes every 2 to 3 hours.  Put a thin cloth between the ice pack and your skin.  ?? Take pain medicines exactly as directed.  ?? If the doctor gave you a prescription medicine for pain, take it as prescribed.  ?? If you are not taking a prescription pain medicine, ask your doctor if you can take an over-the-counter medicine.  ?? Take short walks several times a day. You can start with 5 to 10 minutes, 3 or 4 times a day, and work up to longer walks. Walk on level surfaces and avoid hills and stairs until your back is better.  ?? Return to work and other activities as soon as you can. Continued rest without activity is usually not good for your back.  ?? To prevent future back pain, do exercises to stretch and strengthen your back and stomach. Learn how to use good posture, safe lifting techniques, and proper body mechanics.  When should you call for help?  Call your doctor now or seek immediate medical care if:  ?? You have new or worsening numbness in your legs.  ?? You have new or worsening weakness in your legs. (This could make it hard to stand up.)  ?? You lose control of your bladder or bowels.    Watch closely for changes in your health, and be sure to contact your doctor if:  ?? Your pain gets worse.  ?? You are not getting better after 2 weeks.  Where can you learn more?  Go to http://www.healthwise.net/[employer].  Enter I594 in the search box to learn more about "Back Pain: Care Instructions."  Current as of: October 21, 2015  Content Version: 11.3  ?? 2006-2017 [employer]wise[employer]. Care instructions adapted under license by [employer] (which disclaims liability or warranty for this information). If you have questions about a medical condition or this instruction, always ask your healthcare professional. Healthwise, Inc[employer] disclaims any warranty or liability for your use of this information.

## 2016-02-09 NOTE — Progress Notes (Signed)
Arloa KohBrian Camacho presents today at the clinic for      Chief Complaint   Patient presents with   ??? Pain (Chronic)   ??? Medication Refill        Wt Readings from Last 3 Encounters:   02/09/16 224 lb (101.6 kg)   09/12/15 223 lb 6.4 oz (101.3 kg)   06/18/15 215 lb (97.5 kg)     Temp Readings from Last 3 Encounters:   02/09/16 98.7 ??F (37.1 ??C) (Oral)   09/12/15 98.2 ??F (36.8 ??C) (Oral)   06/18/15 99 ??F (37.2 ??C) (Oral)     BP Readings from Last 3 Encounters:   02/09/16 128/85   09/12/15 (!) 134/93   06/18/15 140/80     Pulse Readings from Last 3 Encounters:   02/09/16 88   09/12/15 83   06/18/15 76       There are no preventive care reminders to display for this patient.    Coordination of Care:    1. Have you been to the ER, urgent care clinic since your last visit?  Hospitalized since your last visit? No    2. Have you seen or consulted any other health care providers [facility] since your last visit?  Include any pap smears or colon screening. No

## 2016-02-09 NOTE — Progress Notes (Signed)
HISTORY OF PRESENT ILLNESS  Alejandro Tate is a 45 y.o. male.  HPI   Pt is here for a refill on his neurontin for his back pain. Pt states this controls his pain well along with the voltaren. Pt has no other complaints.     No Known Allergies    Current Outpatient Prescriptions   Medication Sig   ??? diclofenac EC (VOLTAREN) 50 mg EC tablet Take 1 Tab by mouth three (3) times daily.   ??? gabapentin (NEURONTIN) 600 mg tablet Take 1.5 Tabs by mouth three (3) times daily.   ??? atorvastatin (LIPITOR) 40 mg tablet TAKE ONE TABLET BY MOUTH ONE TIME DAILY      No current facility-administered medications for this visit.        Review of Systems   Constitutional: Negative.  Negative for chills, fever and malaise/fatigue.   HENT: Negative.  Negative for congestion, ear pain, sore throat and tinnitus.    Eyes: Negative.  Negative for blurred vision, double vision and photophobia.   Respiratory: Negative.  Negative for cough, shortness of breath and wheezing.    Cardiovascular: Negative.  Negative for chest pain, palpitations and leg swelling.   Gastrointestinal: Negative.  Negative for abdominal pain, heartburn, nausea and vomiting.   Genitourinary: Negative.  Negative for dysuria, frequency, hematuria and urgency.   Musculoskeletal: Positive for back pain. Negative for joint pain, myalgias and neck pain.   Skin: Negative.  Negative for itching and rash.   Neurological: Negative.  Negative for dizziness, tingling, tremors and headaches.   Psychiatric/Behavioral: Negative.  Negative for depression and memory loss. The patient is not nervous/anxious and does not have insomnia.      Visit Vitals   ??? BP 128/85 (BP 1 Location: Right arm, BP Patient Position: Sitting)   ??? Pulse 88   ??? Temp 98.7 ??F (37.1 ??C) (Oral)   ??? Resp 18   ??? Ht 6\' 1"  (1.854 m)   ??? Wt 224 lb (101.6 kg)   ??? SpO2 97%   ??? BMI 29.55 kg/m2       Physical Exam   Constitutional: He is oriented to person, place, and time. He appears  well-developed and well-nourished. No distress.   Cardiovascular: Normal rate, regular rhythm and normal heart sounds.    Pulmonary/Chest: Effort normal and breath sounds normal.   Musculoskeletal:        Lumbar back: He exhibits decreased range of motion (due to pain) and tenderness. He exhibits no swelling, no edema, no deformity and no spasm.   Neurological: He is alert and oriented to person, place, and time.   Skin: He is not diaphoretic.   Psychiatric: He has a normal mood and affect. His behavior is normal.       ASSESSMENT and PLAN    ICD-10-CM ICD-9-CM    1. Chronic bilateral low back pain with left-sided sciatica M54.42 724.2 diclofenac EC (VOLTAREN) 50 mg EC tablet    G89.29 724.3      338.29      Follow-up Disposition:  Return if symptoms worsen or fail to improve.     Pt expressed understanding of visit summary and plans for any follow ups or referrals as well as any medications prescribed.

## 2016-05-09 ENCOUNTER — Encounter

## 2016-05-10 MED ORDER — DICLOFENAC 50 MG TAB, DELAYED RELEASE
50 mg | ORAL_TABLET | ORAL | 1 refills | Status: AC
Start: 2016-05-10 — End: ?

## 2016-06-12 ENCOUNTER — Encounter

## 2016-06-15 MED ORDER — GABAPENTIN 600 MG TAB
600 mg | ORAL_TABLET | ORAL | 2 refills | Status: DC
Start: 2016-06-15 — End: 2016-09-20

## 2016-07-30 ENCOUNTER — Encounter

## 2016-09-20 ENCOUNTER — Encounter

## 2016-09-21 MED ORDER — GABAPENTIN 600 MG TAB
600 mg | ORAL_TABLET | ORAL | 1 refills | Status: AC
Start: 2016-09-21 — End: ?

## 2019-01-02 IMAGING — CR L-SPINE 4 VWS MIN
1 series · 5 of 5 positions shown · non-contrast
Comparison: None

HISTORY: Radiculopathy, lumbosacral region
TECHNIQUE: Lumbar spine 5 views

[Series 1: ap · 0.17mm/px · 5 of 5 slices shown]
[im 1/5]
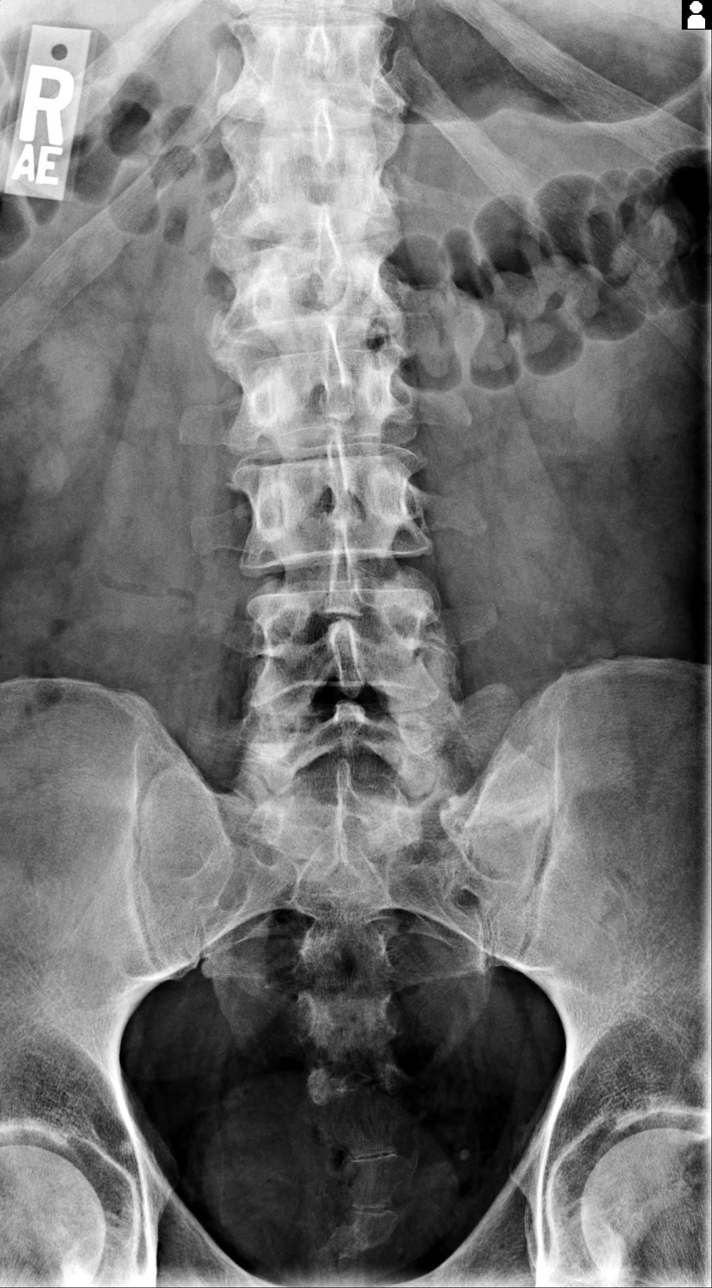
[im 2/5]
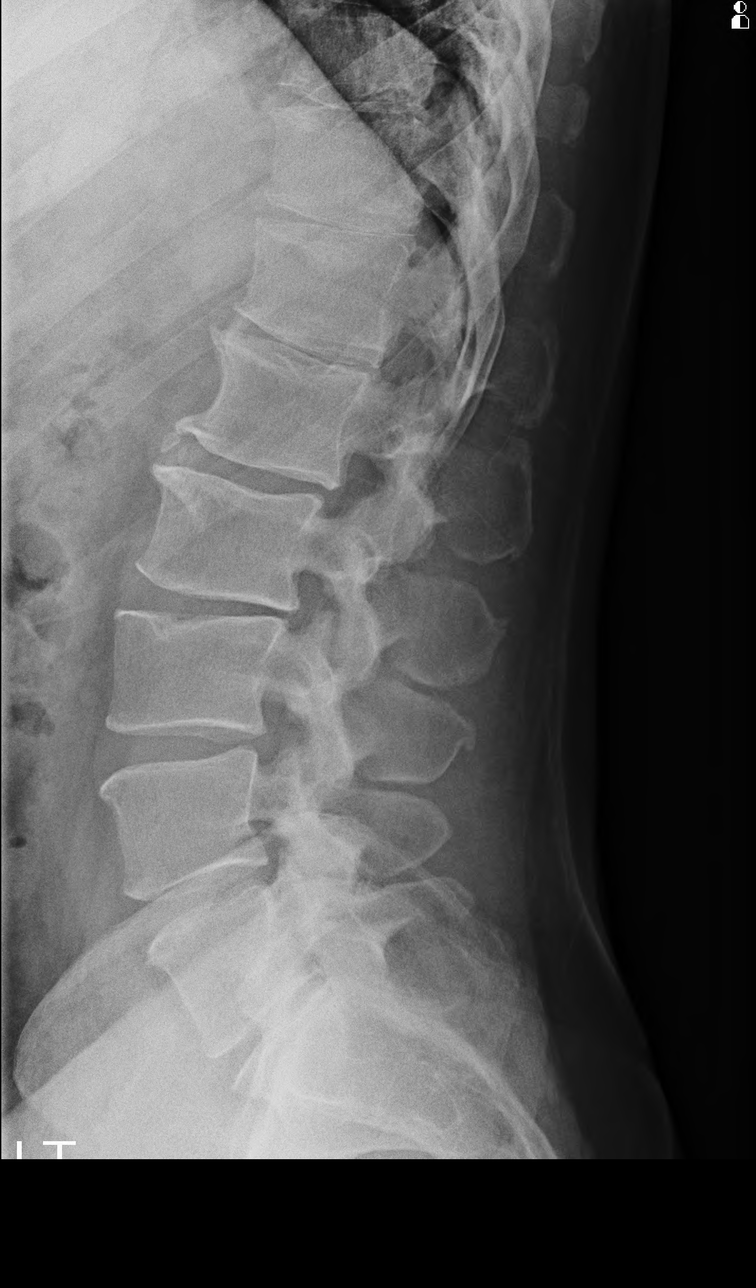
[im 3/5]
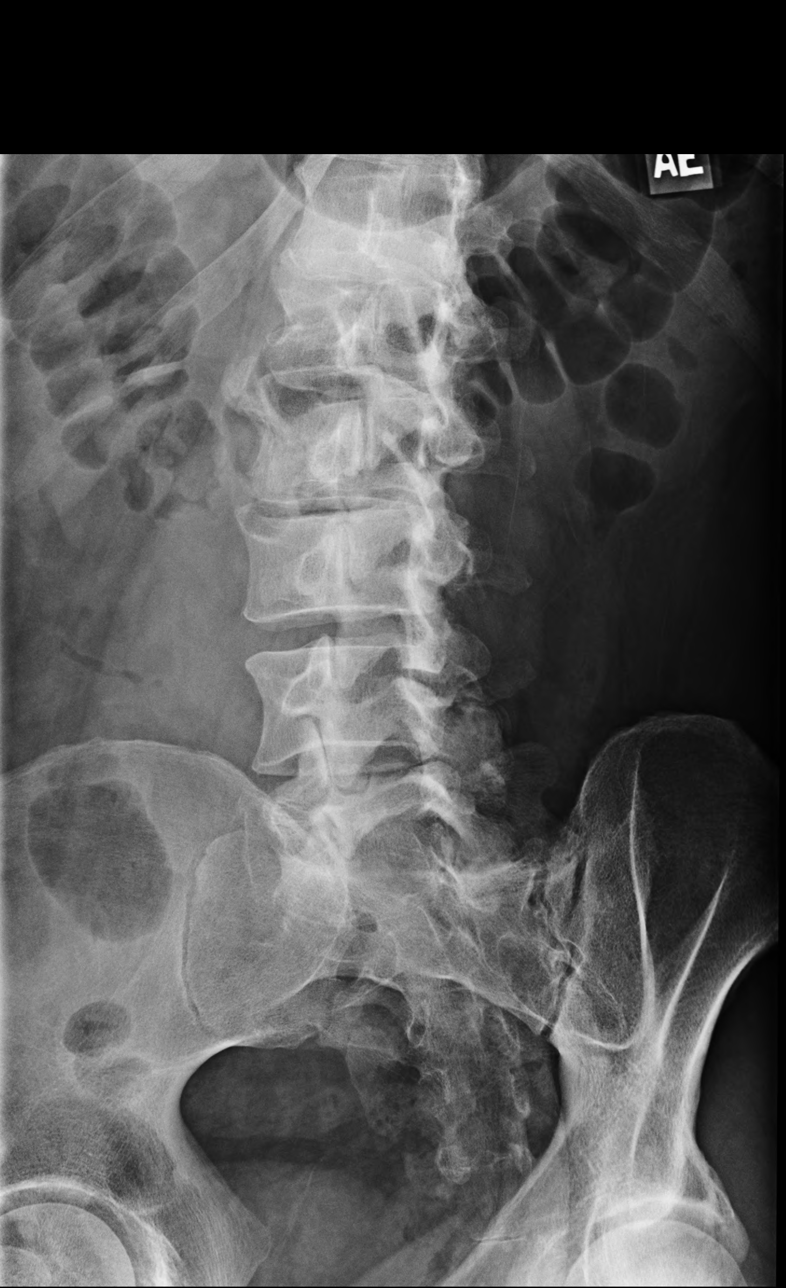
[im 4/5]
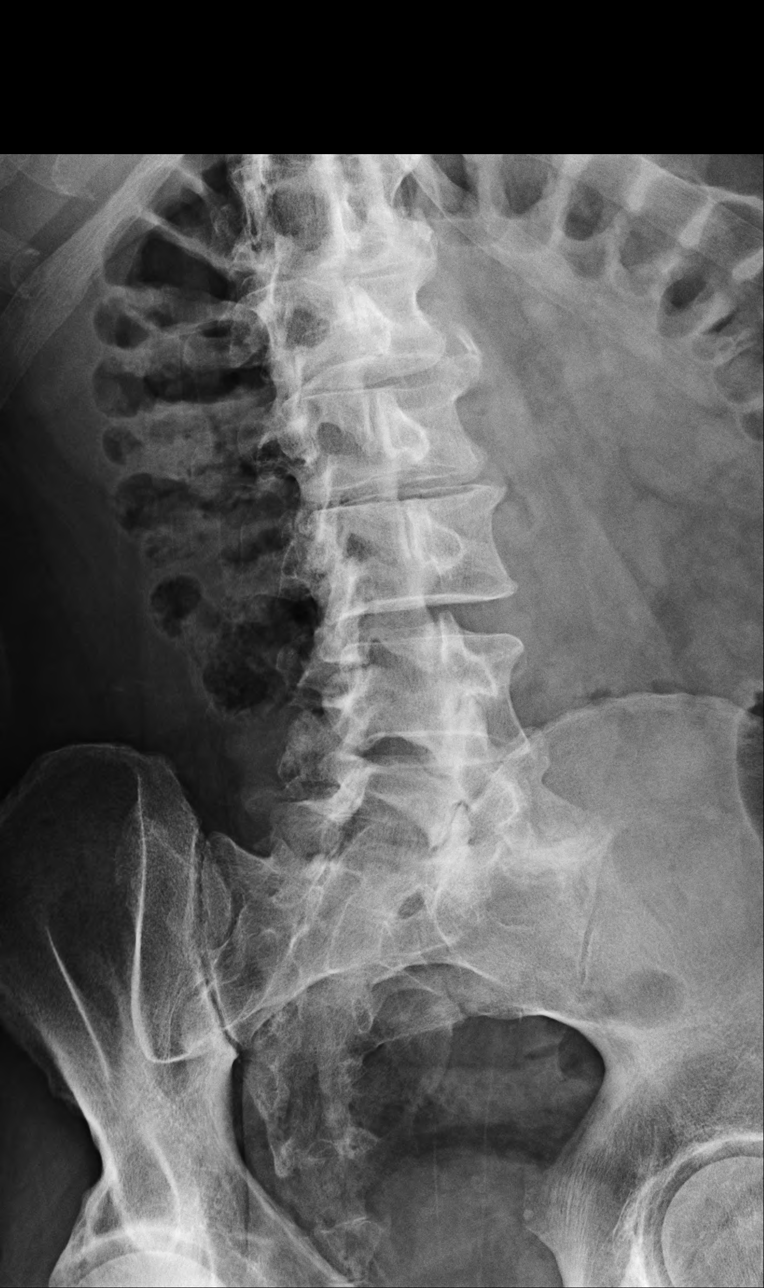
[im 5/5]
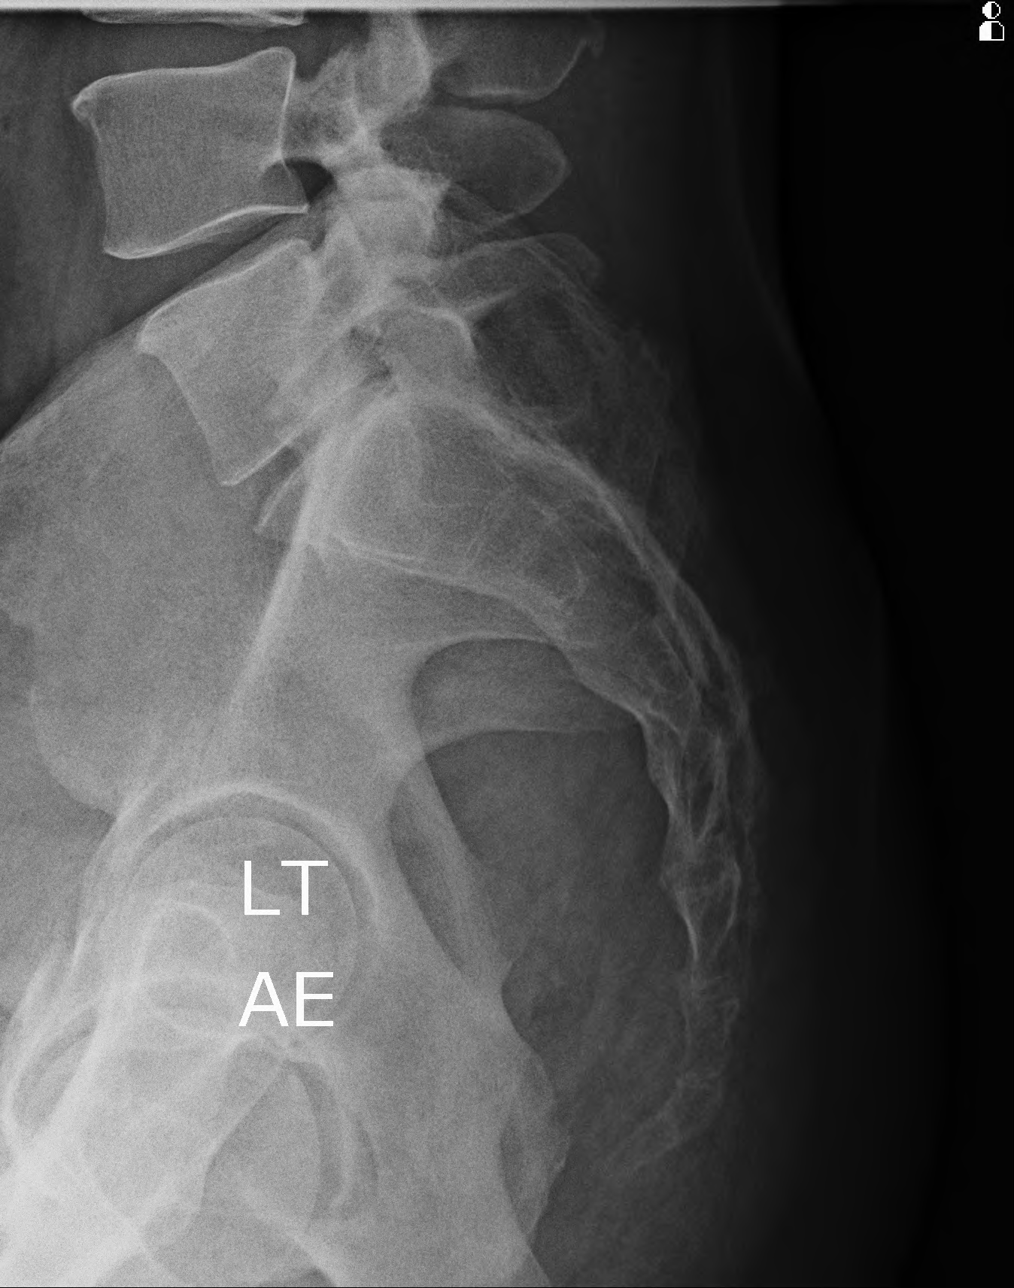

[5 of 5 positions shown; findings below may reference images not displayed]

FINDINGS: 5 views lumbar spine demonstrating total of 5 lumbar segments. Short left 12th rib. No fractures. No destructive lesions. Minimal lumbar levoscoliosis. Multilevel degenerative loss of disc space height with associated small marginal endplate osteophytes. There is moderate facet arthropathy. Paravertebral soft tissues are unremarkable.
IMPRESSION: Minimal levoscoliosis with associated degenerative disc disease and facet arthropathy. No anterolisthesis or retrolisthesis. No fracture or destructive process.

## 2019-02-10 IMAGING — MR MRI LSPINE WO CONTRAST
5 of 6 series · 27 of 48 positions shown · non-contrast
Comparison: Radiographs dated 01/02/2019

HISTORY: Lumbago.
TECHNIQUE: Routine multiplanar MRI of the lumbar spine was performed without IV contrast.

[Series 17: t2_sag · sagittal · 4.0mm · 0.74mm/px · 4 of 15 slices shown]
[im 1/15]
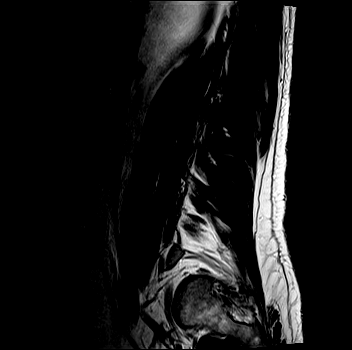
[im 5/15]
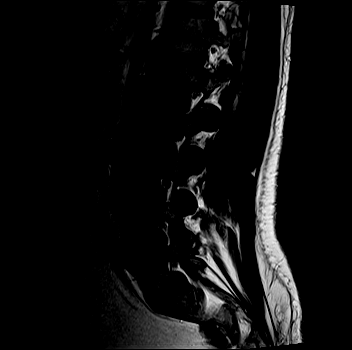
[im 10/15]
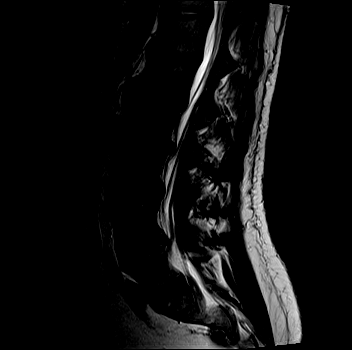
[im 15/15]
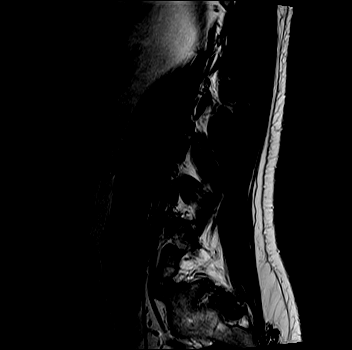

[Series 18: t1_sag · sagittal · 4.0mm · 0.81mm/px · 4 of 15 slices shown]
[im 1/15]
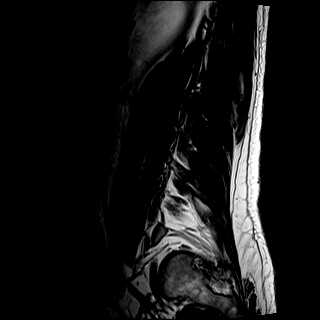
[im 5/15]
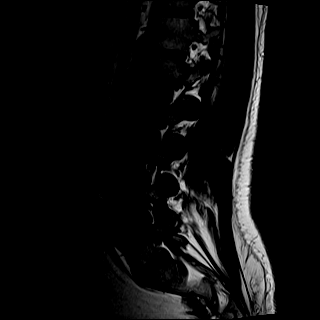
[im 10/15]
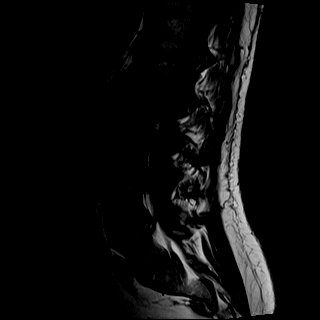
[im 15/15]
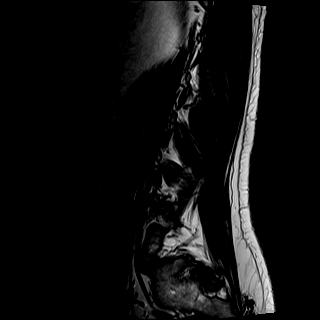

[Series 19: ir_sag · sagittal · 4.0mm · 0.45mm/px · 4 of 15 slices shown]
[im 1/15]
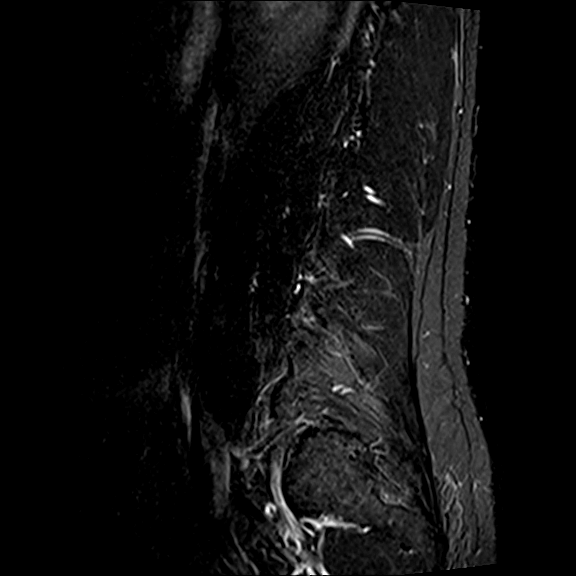
[im 5/15]
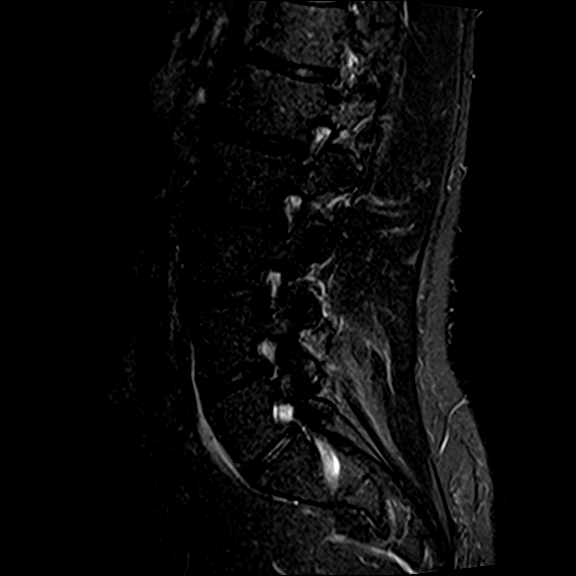
[im 10/15]
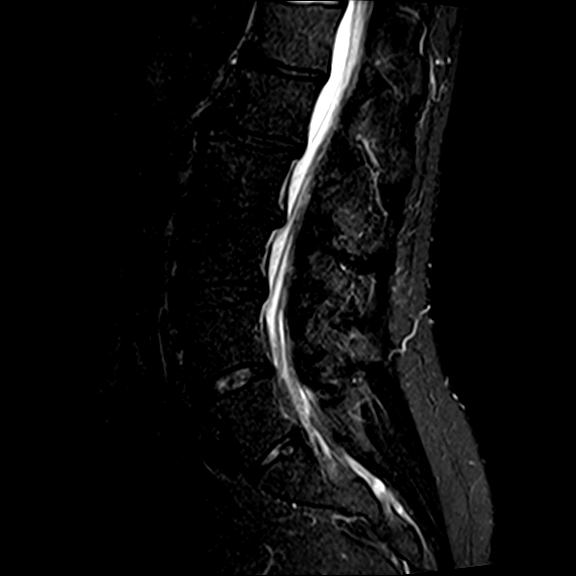
[im 15/15]
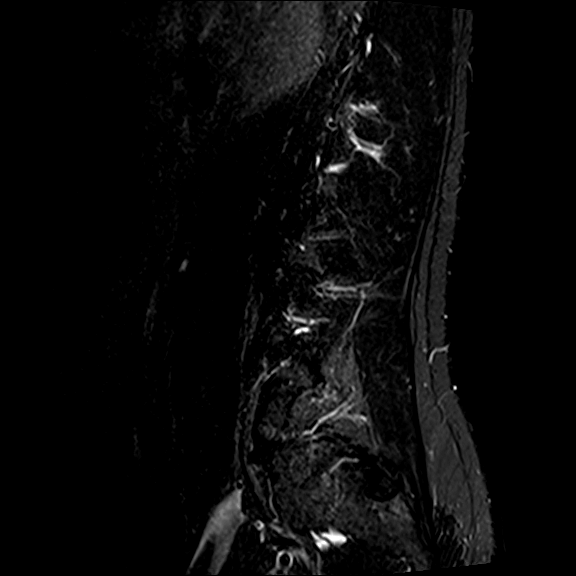

[Series 20: t2_axial · axial · 4.0mm · 0.62mm/px · z∈[-555,-320]mm · 8 of 50 slices shown]
[im 1/50]
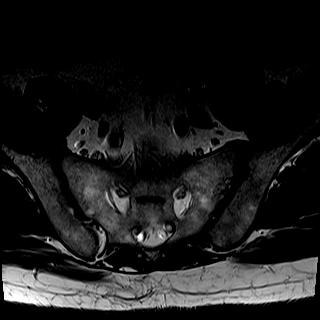
[im 8/50]
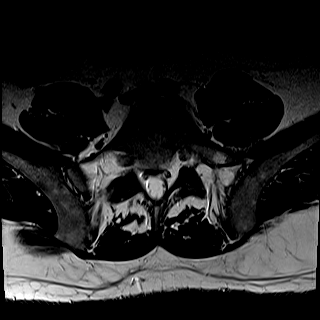
[im 16/50]
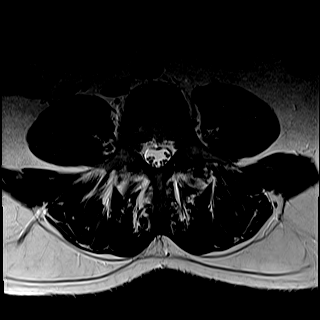
[im 23/50]
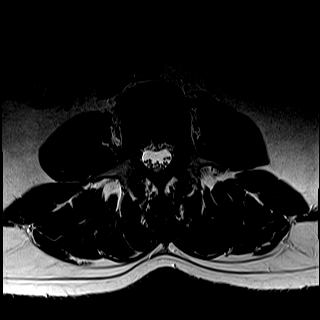
[im 27/50]
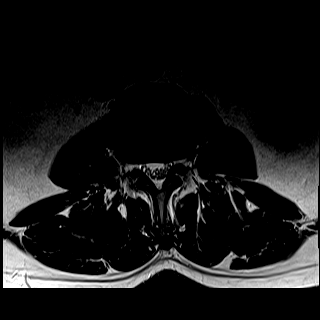
[im 34/50]
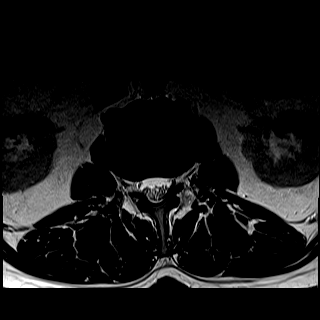
[im 42/50]
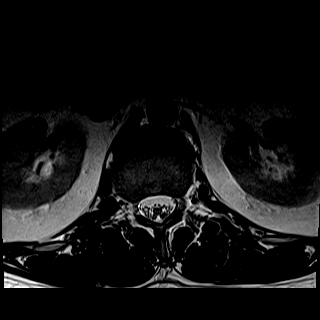
[im 50/50]
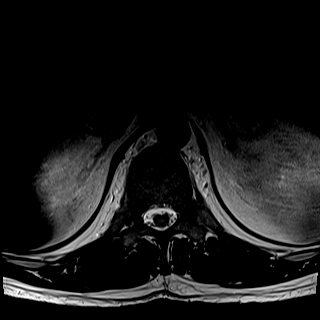

[Series 21: t1_axial_obl · axial · 3.0mm · 0.43mm/px · z∈[-586,-351]mm · 7 of 31 slices shown]
[im 1/31]
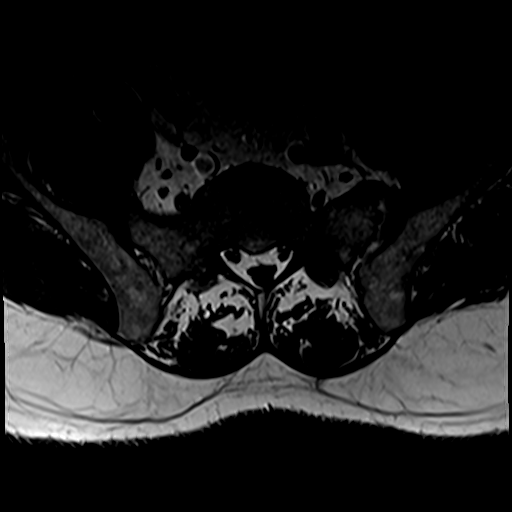
[im 4/31]
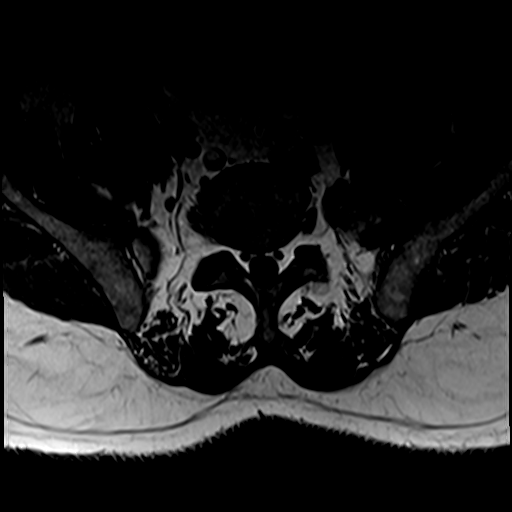
[im 8/31]
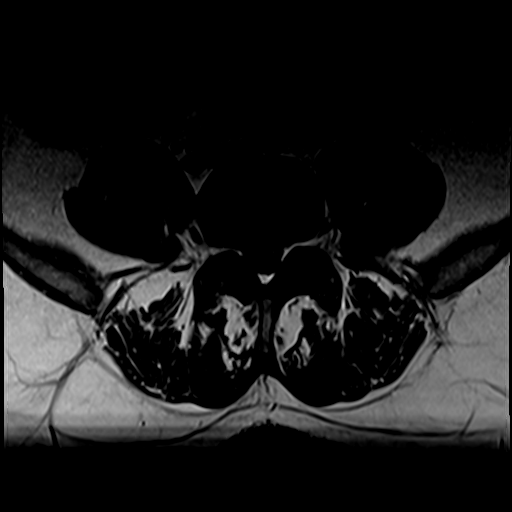
[im 12/31]
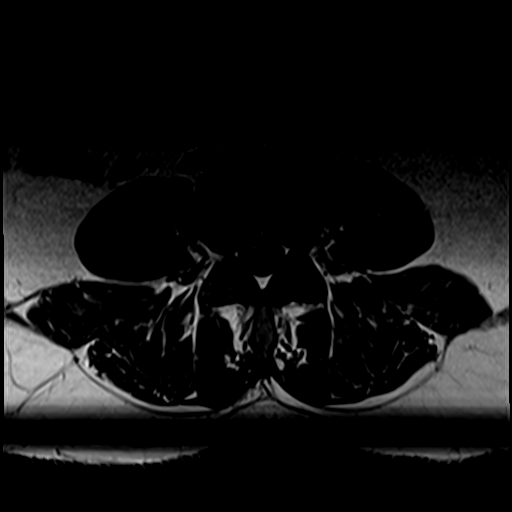
[im 19/31]
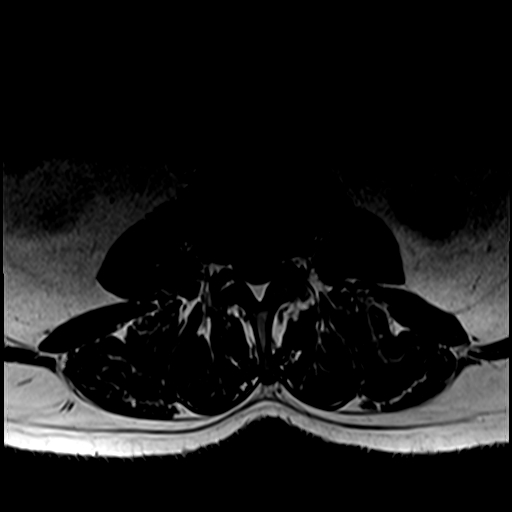
[im 23/31]
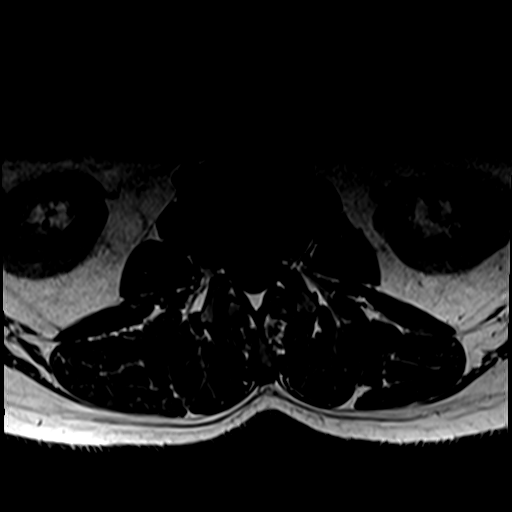
[im 27/31]
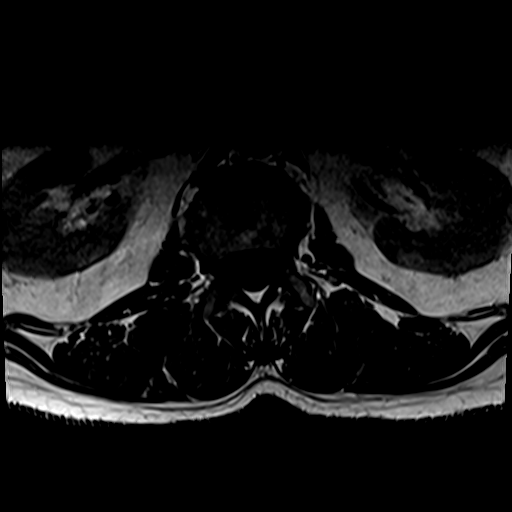

[27 of 48 positions shown; findings below may reference images not displayed]

FINDINGS: Six lumbar segments. L1 demonstrates an elongated and articulated left transverse process.

Lumbar segments maintain appropriate alignment. No fractures. No destructive lesions. Minimal scoliosis. No anomalous segments. Multilevel degenerative disc desiccation, loss of disc space height, and diffuse disc bulging. No abnormal signal in an intradural or paraspinous position. Conus medullaris terminates normally at L1-2 and maintains normal signal. Cauda equina unremarkable.

L1-2: Mild diffuse disc bulge. Mild facet arthropathy. No canal or foraminal stenosis.

L2-3: Mild diffuse disc bulge. Mild facet arthropathy. No canal or foraminal stenosis.

L3-4: Mild diffuse disc bulge. Mild facet arthropathy. No canal stenosis. Mild foraminal stenoses.

L4-5: Mild diffuse disc bulge with superimposed shallow right paracentral protrusion measuring approximately 2 mm in AP dimension. This abuts the thecal sac, producing minimal posterior displacement of the descending right L5 nerve root. Borderline canal stenosis. Mild foraminal stenoses. Bilateral facet arthropathy.

L5-6: Diffuse disc bulge with superimposed central/right paracentral broad-based protrusion measuring 3.8 mm in AP dimension. This impinges upon the thecal sac producing posterior displacement of the descending right L6 nerve root. There is mild canal stenosis. Bilateral facet arthropathy. Mild bilateral foraminal stenoses.

L6-S1: Minor diffuse disc bulge. Mild facet arthropathy. No canal stenosis. Mild right foraminal stenosis.
IMPRESSION: 1. Six lumbar segments as discussed above.

2. Multilevel degenerative disc disease and facet arthropathy with superimposed protrusions at L4-5 and L5-6 as detailed above.

3. No fractures or destructive lesions. Minimal scoliosis. No anterolisthesis or retrolisthesis. No anomalous segments.

## 2020-10-01 IMAGING — MR MRI LSPINE WO CONTRAST
6 series · 39 of 48 positions shown · non-contrast
Comparison: Previous MRI lumbar spine without contrast February 10, 2019

INDICATION: Chronic low back pain
TECHNIQUE: MRI lumbar spine without contrast. Sagittal and axial multisequence MR images of the lumbar spine were performed without intravenous contrast with coronal T1-weighted images. With correlation with lumbar spine x-rays dated January 02, 2019 that show 5 lumbar-type vertebrae with left partial sacralization of L6 and small left lower thoracic rib.

[Series 11: iii_aaspine_lspine_mpr_cor · coronal · 1.7mm · 1.67mm/px · 12 of 80 slices shown]
[im 1/80]
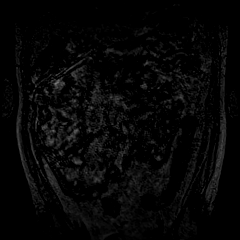
[im 5/80]
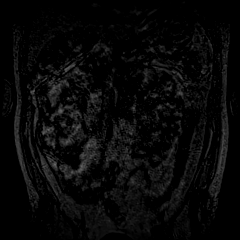
[im 10/80]
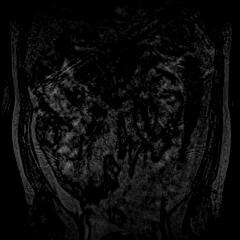
[im 14/80]
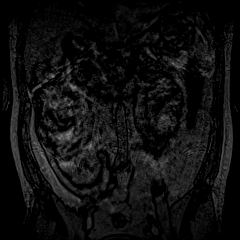
[im 24/80]
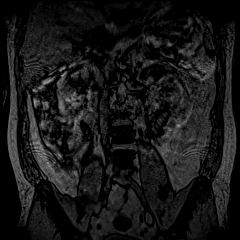
[im 33/80]
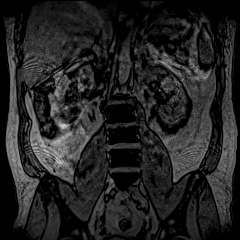
[im 42/80]
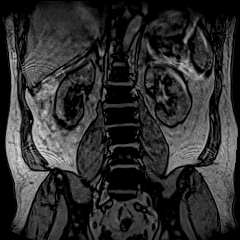
[im 47/80]
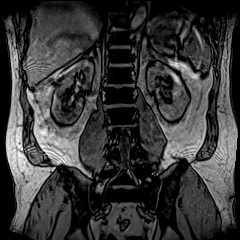
[im 56/80]
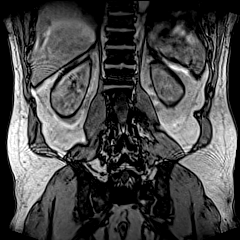
[im 66/80]
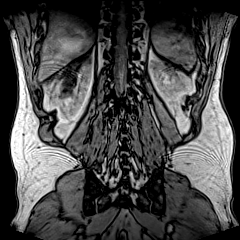
[im 70/80]
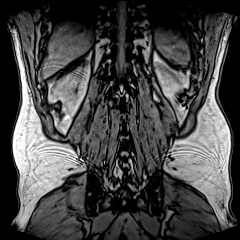
[im 75/80]
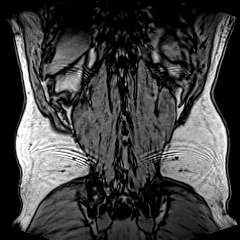

[Series 18: t1_sag · sagittal · 4.0mm · 0.81mm/px · 4 of 17 slices shown]
[im 1/17]
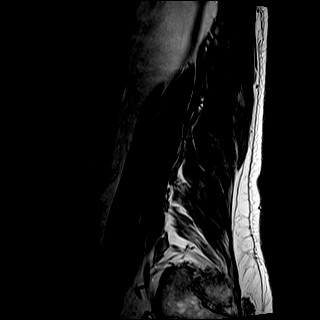
[im 6/17]
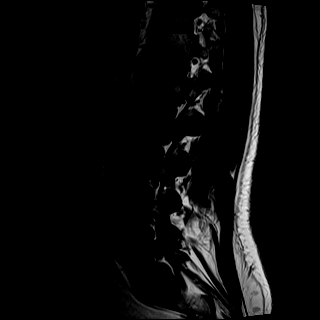
[im 11/17]
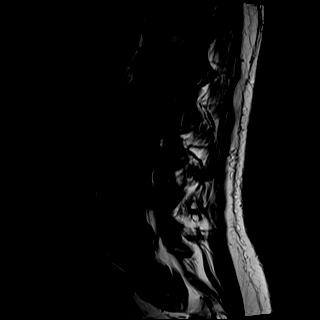
[im 17/17]
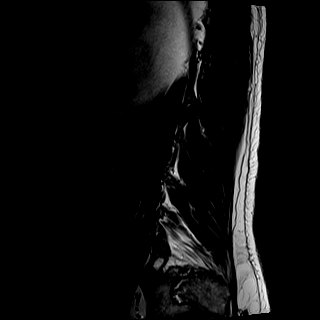

[Series 19: ir_sag · sagittal · 4.0mm · 1.02mm/px · 4 of 17 slices shown]
[im 1/17]
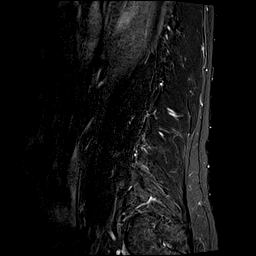
[im 6/17]
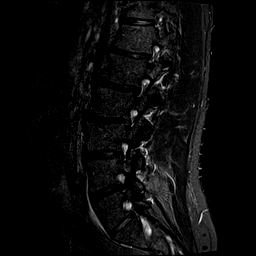
[im 11/17]
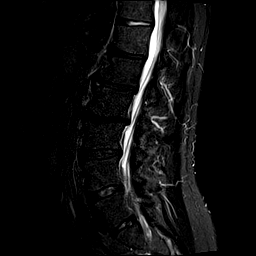
[im 17/17]
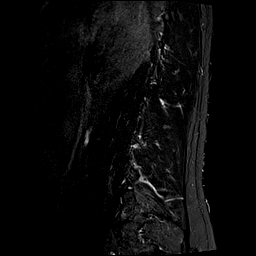

[Series 20: t2_sag · sagittal · 4.0mm · 0.74mm/px · 4 of 17 slices shown]
[im 1/17]
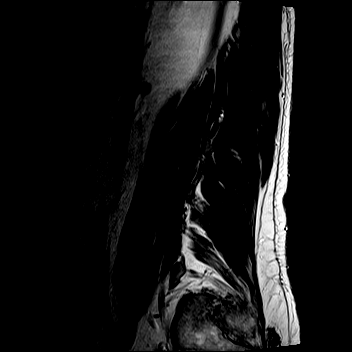
[im 6/17]
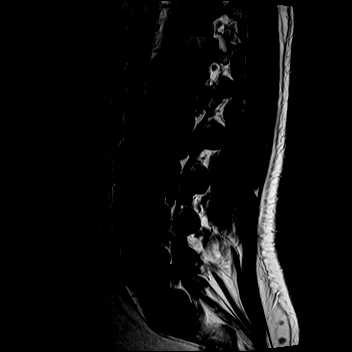
[im 11/17]
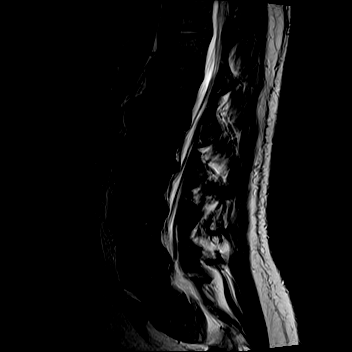
[im 17/17]
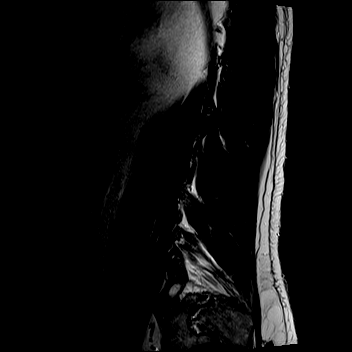

[Series 21: t2_axial · axial · 4.0mm · 0.62mm/px · z∈[-567,-333]mm · 8 of 50 slices shown]
[im 1/50]
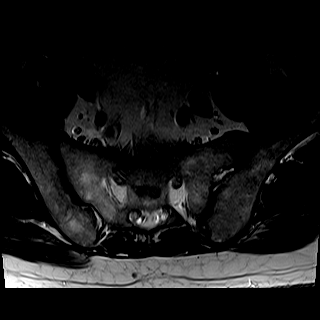
[im 10/50]
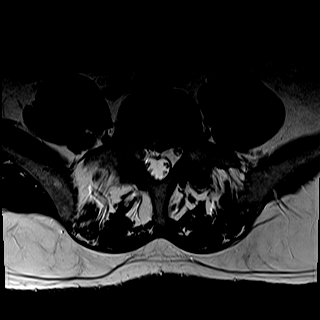
[im 15/50]
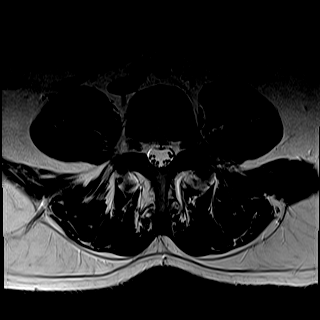
[im 20/50]
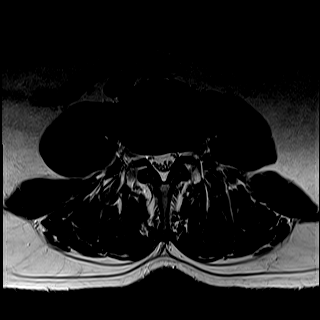
[im 30/50]
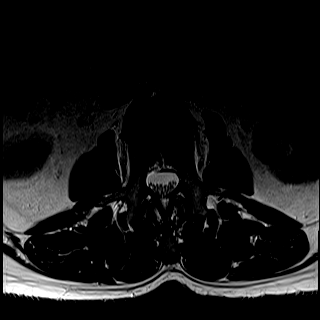
[im 35/50]
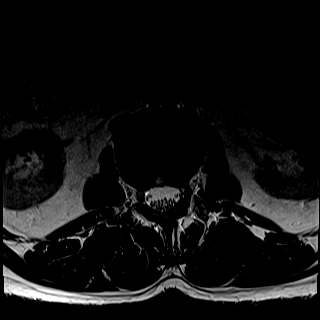
[im 40/50]
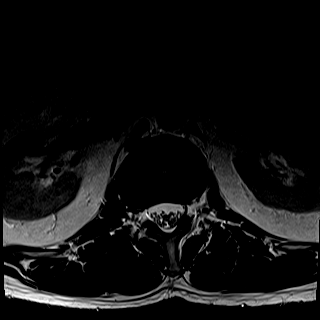
[im 50/50]
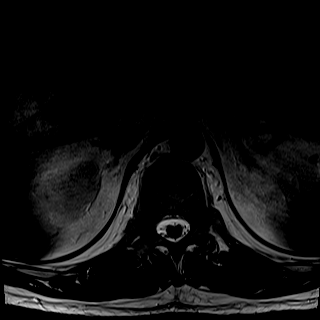

[Series 22: t1_axial_obl · axial · 3.0mm · 0.43mm/px · z∈[-594,-337]mm · 7 of 31 slices shown]
[im 1/31]
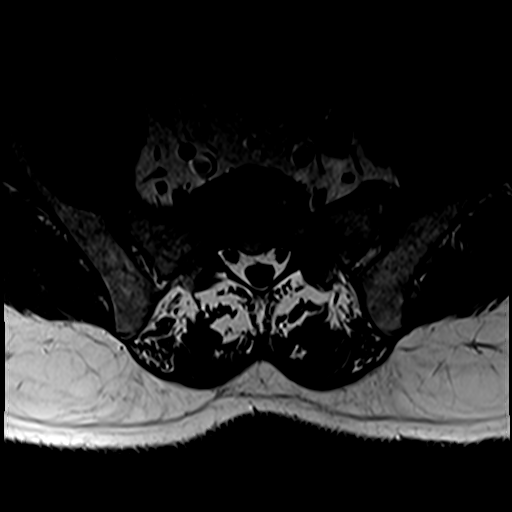
[im 6/31]
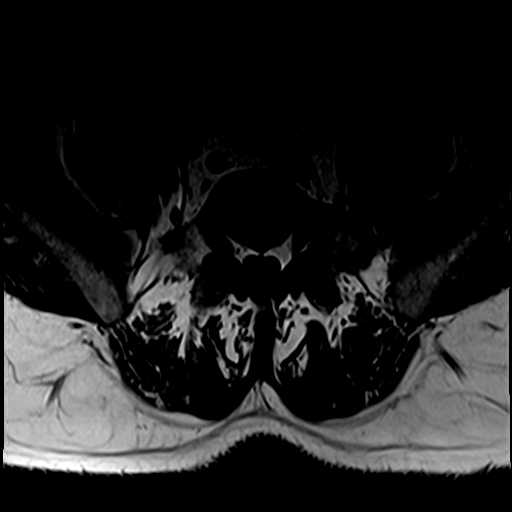
[im 11/31]
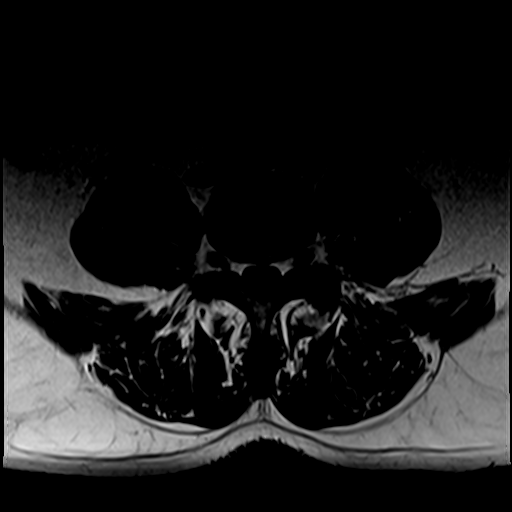
[im 16/31]
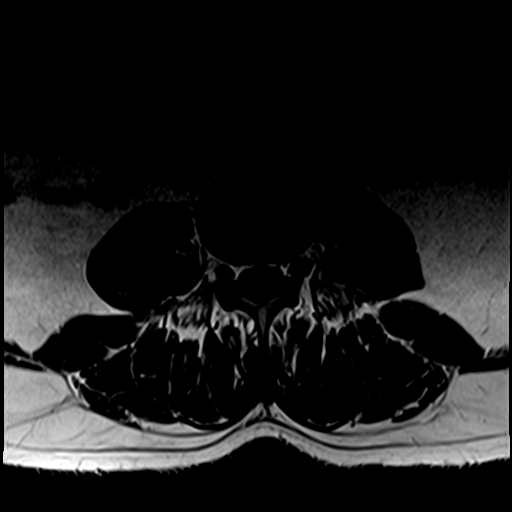
[im 21/31]
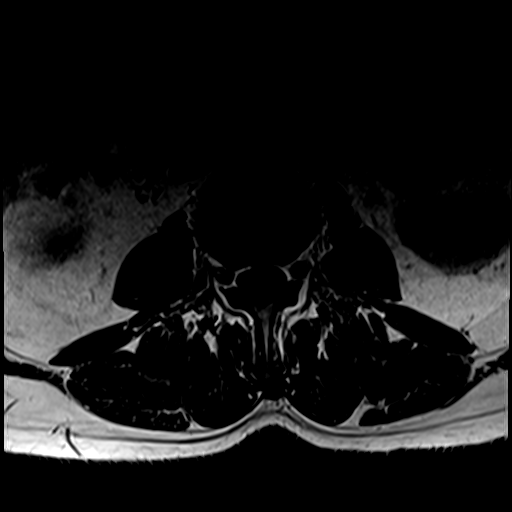
[im 26/31]
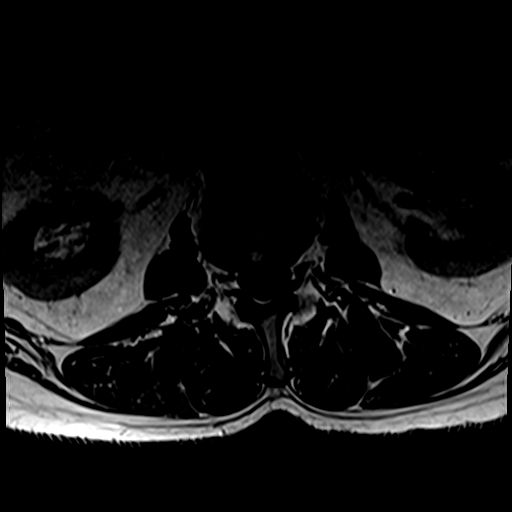
[im 31/31]
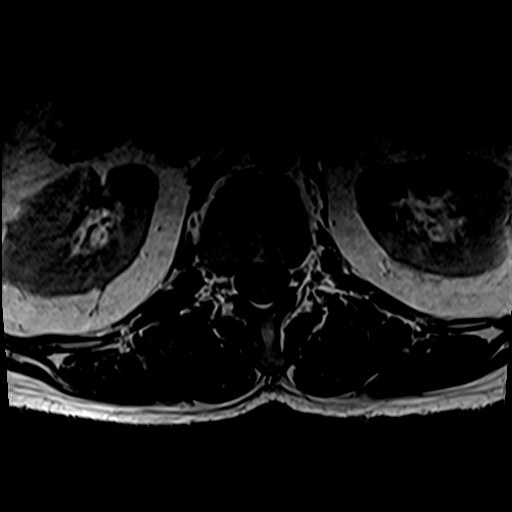

[39 of 48 positions shown; findings below may reference images not displayed]

FINDINGS: From the lateral scout image annotation patient has 7 cervical vertebrae, 12 thoracic vertebrae and 6 lumbar type vertebrae. However, lumbar spine x-rays show 5 lumbar-type vertebrae with small left lateral lower rib most likely representing a 13th rib.

There is normal alignment of the vertebral elements. Decrease in disc space is seen throughout the visualized lower thoracic and lumbar spine with disc desiccation. These findings are in keeping with degenerative disc disease. Small anterior/lateral osteophytes are seen at L1-L2. The signal intensity of the bone marrow and distal neural cord is normal with conus ending at the superior endplate of L2 vertebral body. There is normal alignment of the vertebral elements. The spinal canal is of normal caliber measuring 13 mm anteroposteriorly.

At L1-2 there is a disc bulge with mild facet arthropathy producing no significant spinal canal or foraminal stenosis.

At L2-L3 there is a broad bulge with moderate bilateral facet arthropathy producing moderate segmental spinal canal stenosis without significant spinal canal stenosis.

At L3-L4 there is a broad disc bulge eccentric towards the right side with mild degenerative changes of the facets. This produces mild segmental spinal canal stenosis with moderate bilateral foraminal stenosis.

At L4-L5 there is a broad disc bulge with moderate left and mild right facet arthropathy. This produces no significant spinal canal stenosis with minimal bilateral foraminal stenosis. There is increased signal intensity of the posterior annulus in keeping with an annular tear.

L5-S1 there is no disc herniation, spinal canal or foraminal stenosis.
IMPRESSION: 1. Patient has a 13 type thoracic vertebrae with 5 lumbar-type vertebrae with left partial sacralization of L6.

2. Mild degenerative disc disease and facet arthropathy of the lumbar spine with L4-5 posterior annular tear. No disc herniation, spinal canal or foraminal stenosis.

## 2021-12-18 IMAGING — US US ABDOMEN COMPLETE
1 series · 13 of 25 positions shown · non-contrast
Comparison: None.

HISTORY: 51-year-old male with abdominal pain per patient, right side pain.
TECHNIQUE: Using real-time and Doppler ultrasound, the abdomen was evaluated.

[Series 1: us abdomen complete · 13 of 71 slices shown]
[im 1/71]
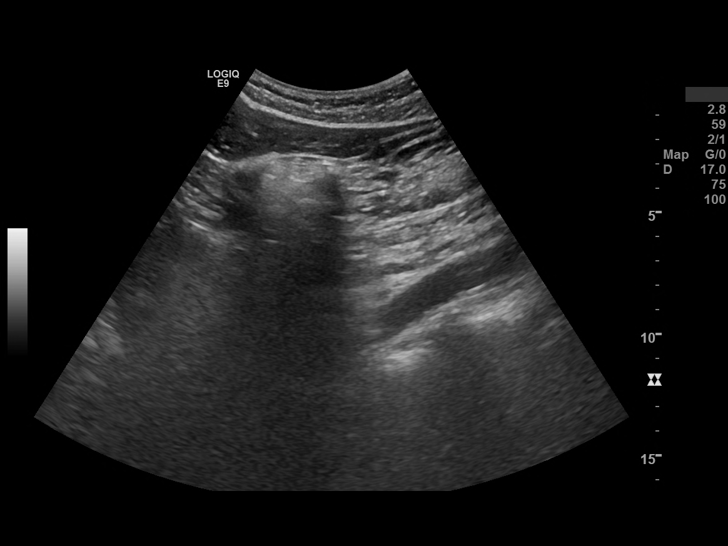
[im 6/71]
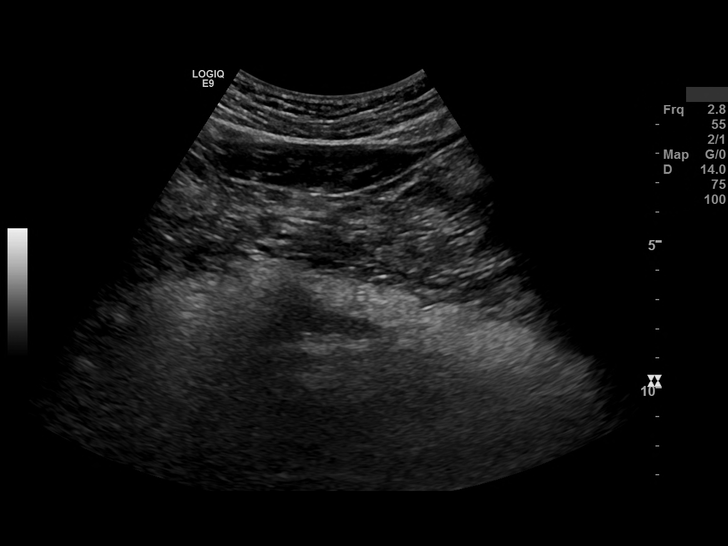
[im 12/71]
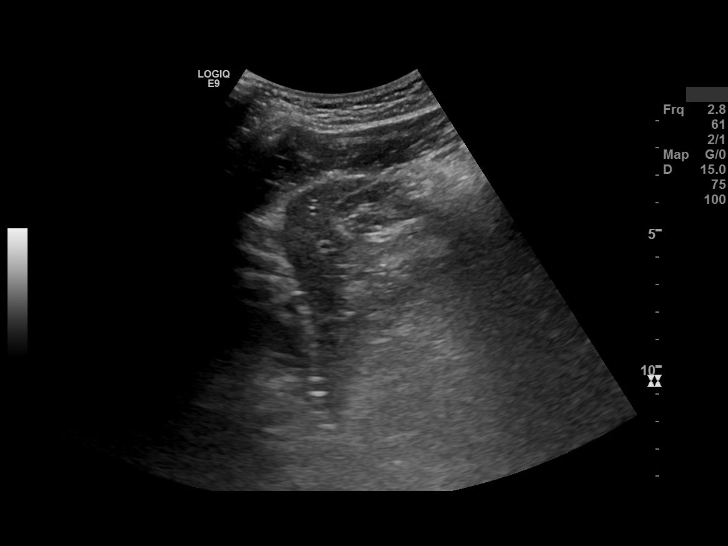
[im 18/71]
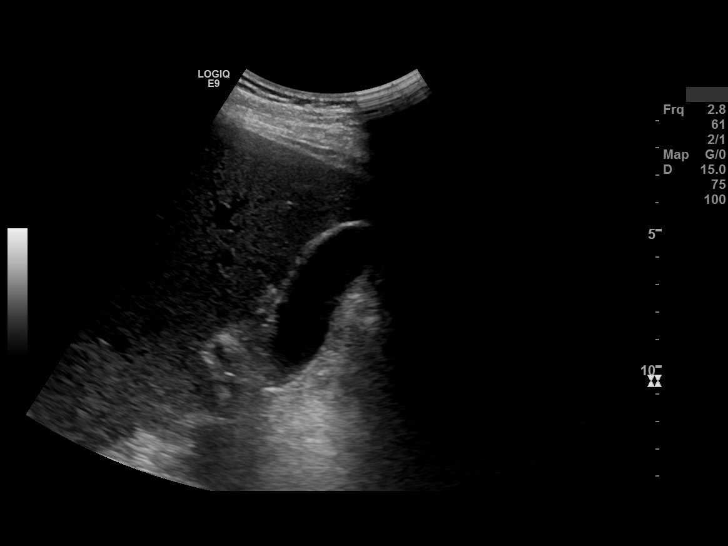
[im 24/71]
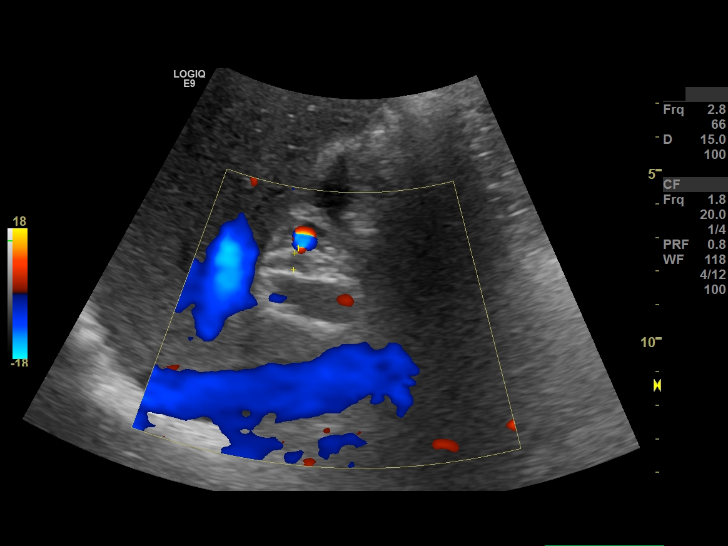
[im 30/71]
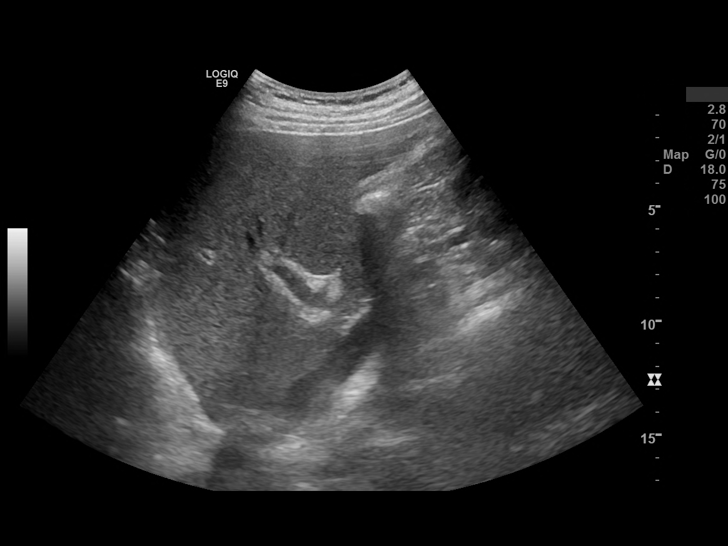
[im 36/71]
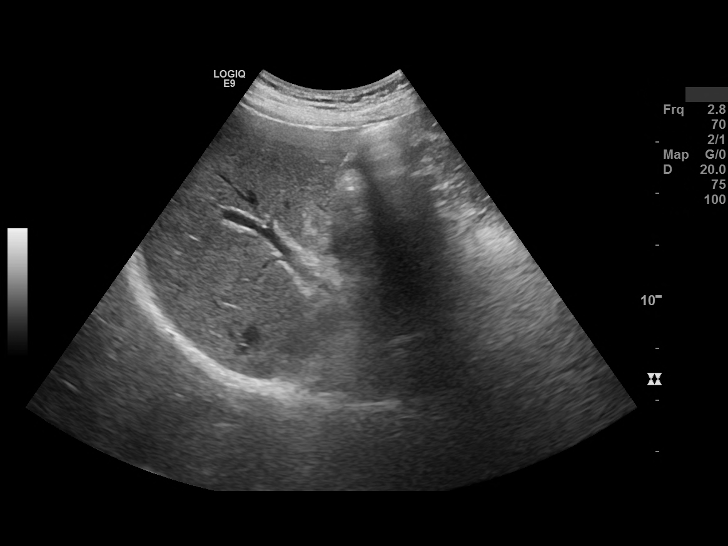
[im 41/71]
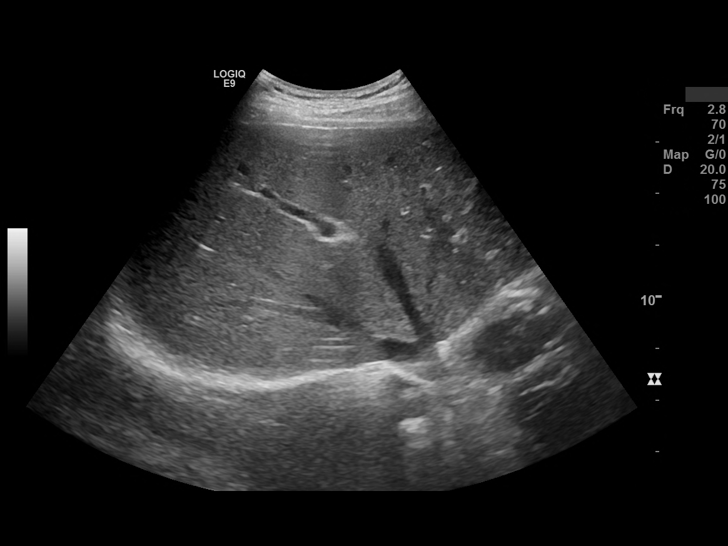
[im 47/71]
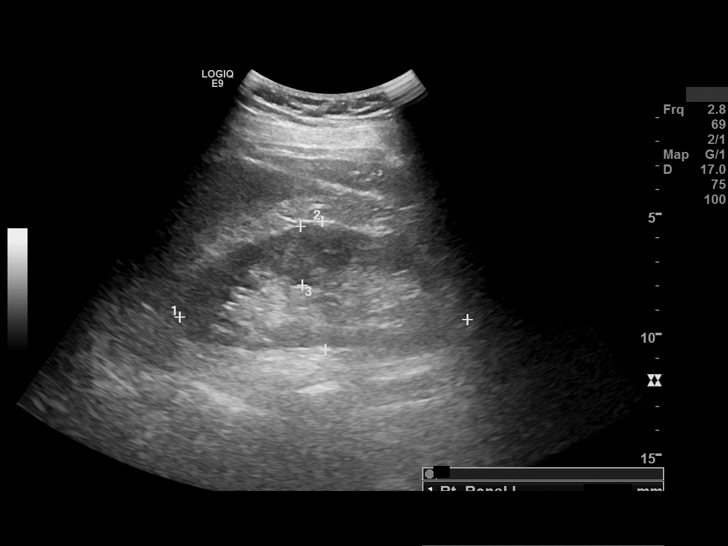
[im 53/71]
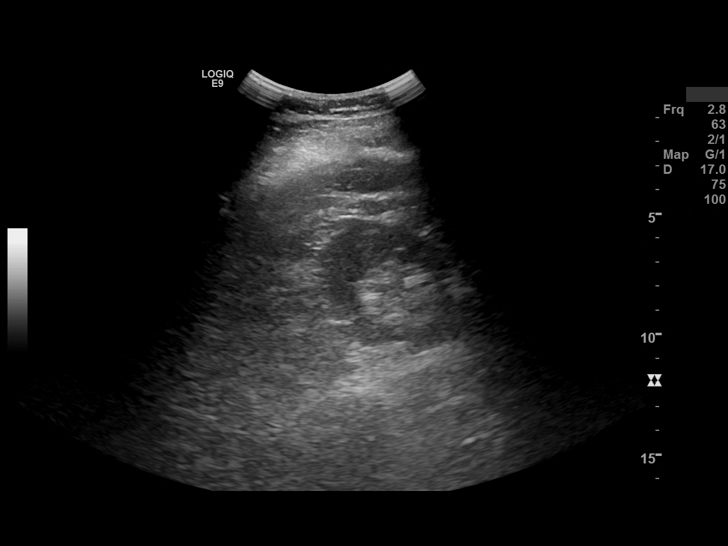
[im 59/71]
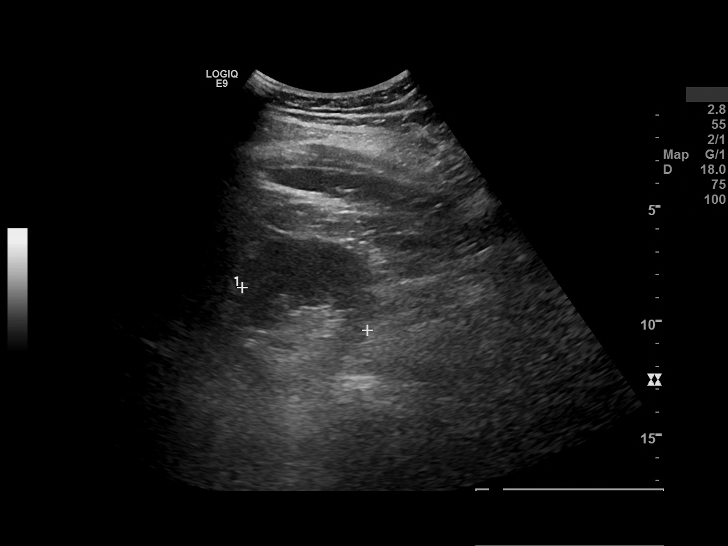
[im 65/71]
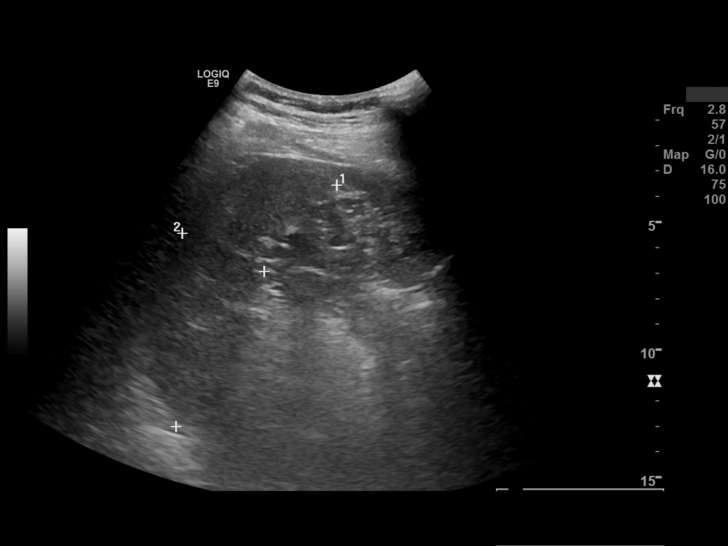
[im 71/71]
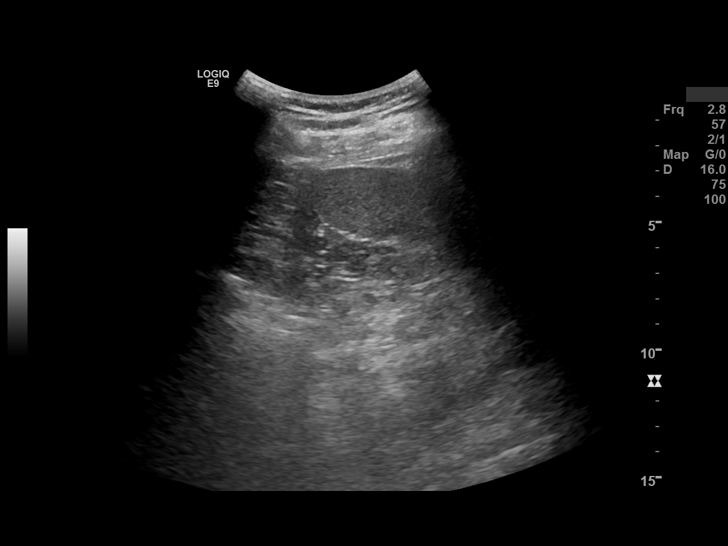

[13 of 25 positions shown; findings below may reference images not displayed]

FINDINGS: The visualized portion of the pancreas is unremarkable. The visualized portion of the abdominal aorta and inferior vena cava is unremarkable.

Liver: Length of  152 mm in the right midclavicular line. The liver echotexture is hyperechoic with liver to renal BOB-MANUEL ratio of nearly 2 which could be fatty liver or chronic liver disease related. Elastography would be needed to differentiate. Hepatopetal flow is identified in the portal vein. No focal masses identified. No intrahepatic biliary ductal dilatation is seen. 

Gallbladder: The gallbladder is within normal limits. The gallbladder wall thickness measures 3.7 mm. There is a  negative sonographic Murphy's sign. The common bile duct measures 5.04  mm.

Spleen measures 113 x 35 x 31 mm and demonstrates normal echotexture without focal lesions. Right kidney measures 119 x 53 x 50 mm. The right kidney volume measures 167.1 ml.

Left kidney measures 118 x 64 x 58 mm. The left kidney volume measures 229.6 ml.

Kidneys are sonographically normal in appearance.  No focal mass, hydronephrosis, nephrolithiasis or cortical thinning is identified.

No ascites is seen.
IMPRESSION: Echogenic liver, otherwise unremarkable.
